# Patient Record
Sex: Male | Born: 1970 | Hispanic: No | Marital: Married | Smoking: Former smoker
Health system: Southern US, Community
[De-identification: ages and names within clinical notes are randomized; demographics above are authoritative.]

## PROBLEM LIST (undated history)

## (undated) DIAGNOSIS — M1990 Unspecified osteoarthritis, unspecified site: Secondary | ICD-10-CM

## (undated) DIAGNOSIS — M5430 Sciatica, unspecified side: Secondary | ICD-10-CM

## (undated) DIAGNOSIS — K219 Gastro-esophageal reflux disease without esophagitis: Secondary | ICD-10-CM

## (undated) HISTORY — DX: Sciatica, unspecified side: M54.30

---
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
## 2003-03-02 ENCOUNTER — Encounter: Payer: Self-pay | Admitting: Emergency Medicine

## 2003-03-02 ENCOUNTER — Emergency Department (HOSPITAL_COMMUNITY): Admission: EM | Admit: 2003-03-02 | Discharge: 2003-03-02 | Payer: Self-pay | Admitting: Emergency Medicine

## 2009-01-18 ENCOUNTER — Emergency Department (HOSPITAL_COMMUNITY): Admission: EM | Admit: 2009-01-18 | Discharge: 2009-01-18 | Payer: Self-pay | Admitting: Family Medicine

## 2012-03-18 ENCOUNTER — Encounter (HOSPITAL_COMMUNITY): Payer: Self-pay | Admitting: Emergency Medicine

## 2012-03-18 ENCOUNTER — Emergency Department (HOSPITAL_COMMUNITY): Payer: Self-pay

## 2012-03-18 ENCOUNTER — Emergency Department (HOSPITAL_COMMUNITY)
Admission: EM | Admit: 2012-03-18 | Discharge: 2012-03-18 | Disposition: A | Discharge status: Home / Self Care | Payer: Self-pay | Attending: Emergency Medicine | Admitting: Emergency Medicine

## 2012-03-18 DIAGNOSIS — IMO0002 Reserved for concepts with insufficient information to code with codable children: Secondary | ICD-10-CM | POA: Insufficient documentation

## 2012-03-18 DIAGNOSIS — X58XXXA Exposure to other specified factors, initial encounter: Secondary | ICD-10-CM | POA: Insufficient documentation

## 2012-03-18 DIAGNOSIS — S46912A Strain of unspecified muscle, fascia and tendon at shoulder and upper arm level, left arm, initial encounter: Secondary | ICD-10-CM

## 2012-03-18 DIAGNOSIS — Z87891 Personal history of nicotine dependence: Secondary | ICD-10-CM | POA: Insufficient documentation

## 2012-03-18 MED ORDER — IBUPROFEN 400 MG PO TABS
800.0000 mg | ORAL_TABLET | Freq: Once | ORAL | Status: AC
  Administered 2012-03-18: 800 mg via ORAL
  Filled 2012-03-18: qty 2

## 2012-03-18 NOTE — ED Notes (Signed)
Pt. Stated, i started having shoulder pain and in my neck and causing my lt. Arm to be numb

## 2012-03-18 NOTE — Discharge Instructions (Signed)
Muscle Strain A muscle strain, or pulled muscle, occurs when a muscle is over-stretched. A small number of muscle fibers may also be torn. This is especially common in athletes. This happens when a sudden violent force placed on a muscle pushes it past its capacity. Usually, recovery from a pulled muscle takes 1 to 2 weeks. But complete healing will take 5 to 6 weeks. There are millions of muscle fibers. Following injury, your body will usually return to normal quickly. HOME CARE INSTRUCTIONS   While awake, apply ice to the sore muscle for 15 to 20 minutes each hour for the first 2 days. Put ice in a plastic bag and place a towel between the bag of ice and your skin.   Do not use the pulled muscle for several days. Do not use the muscle if you have pain.   You may wrap the injured area with an elastic bandage for comfort. Be careful not to bind it too tightly. This may interfere with blood circulation.   Only take over-the-counter or prescription medicines for pain, discomfort, or fever as directed by your caregiver. Do not use aspirin as this will increase bleeding (bruising) at injury site.   Warming up before exercise helps prevent muscle strains.  SEEK MEDICAL CARE IF:  There is increased pain or swelling in the affected area. MAKE SURE YOU:   Understand these instructions.   Will watch your condition.   Will get help right away if you are not doing well or get worse.  Document Released: 09/13/2005 Document Revised: 09/02/2011 Document Reviewed: 04/12/2007 [facility] Patient Information 2012 [place], [place].Shoulder Sprain A shoulder sprain is the result of damage to the tough, fiber-like tissues (ligaments) that help hold your shoulder in place. The ligaments may be stretched or torn. Besides the main shoulder joint (the ball and socket), there are several smaller joints that connect the bones in this area. A sprain usually involves one of those joints. Most often it is the  acromioclavicular (or AC) joint. That is the joint that connects the collarbone (clavicle) and the shoulder blade (scapula) at the top point of the shoulder blade (acromion). A shoulder sprain is a mild form of what is called a shoulder separation. Recovering from a shoulder sprain may take some time. For some, pain lingers for several months. Most people recover without long term problems. CAUSES   A shoulder sprain is usually caused by some kind of trauma. This might be:   Falling on an outstretched arm.   Being hit hard on the shoulder.   Twisting the arm.   Shoulder sprains are more likely to occur in people who:   Play sports.   Have balance or coordination problems.  SYMPTOMS   Pain when you move your shoulder.   Limited ability to move the shoulder.   Swelling and tenderness on top of the shoulder.   Redness or warmth in the shoulder.   Bruising.   A change in the shape of the shoulder.  DIAGNOSIS  Your healthcare provider may:  Ask about your symptoms.   Ask about recent activity that might have caused those symptoms.   Examine your shoulder. You may be asked to do simple exercises to test movement. The other shoulder will be examined for comparison.   Order some tests that provide a look inside the body. They can show the extent of the injury. The tests could include:   X-rays.   CT (computed tomography) scan.   MRI (magnetic resonance imaging)  scan.  RISKS AND COMPLICATIONS  Loss of full shoulder motion.   Ongoing shoulder pain.  TREATMENT  How long it takes to recover from a shoulder sprain depends on how severe it was. Treatment options may include:  Rest. You should not use the arm or shoulder until it heals.   Ice. For 2 or 3 days after the injury, put an ice pack on the shoulder up to 4 times a day. It should stay on for 15 to 20 minutes each time. Wrap the ice in a towel so it does not touch your skin.   Over-the-counter medicine to relieve  pain.   A sling or brace. This will keep the arm still while the shoulder is healing.   Physical therapy or rehabilitation exercises. These will help you regain strength and motion. Ask your healthcare provider when it is OK to begin these exercises.   Surgery. The need for surgery is rare with a sprained shoulder, but some people may need surgery to keep the joint in place and reduce pain.  HOME CARE INSTRUCTIONS   Ask your healthcare provider about what you should and should not do while your shoulder heals.   Make sure you know how to apply ice to the correct area of your shoulder.   Talk with your healthcare provider about which medications should be used for pain and swelling.   If rehabilitation therapy will be needed, ask your healthcare provider to refer you to a therapist. If it is not recommended, then ask about at-home exercises. Find out when exercise should begin.  SEEK MEDICAL CARE IF:  Your pain, swelling, or redness at the joint increases. SEEK IMMEDIATE MEDICAL CARE IF:   You have a fever.   You cannot move your arm or shoulder.  Document Released: 01/30/2009 Document Revised: 09/02/2011 Document Reviewed: 01/30/2009 [facility] Patient Information 2012 [place], [place].

## 2012-03-18 NOTE — ED Provider Notes (Signed)
History   This chart was scribed for Cyndra Numbers, MD scribed by Magnus Sinning. The patient was seen in room TR10C/TR10C seen at 12:25.   CSN: [id number]  Arrival date & time 03/18/12  1106   First MD Initiated Contact with Patient 03/18/12 1203      Chief Complaint  Patient presents with  . Shoulder Pain    (Consider location/radiation/quality/duration/timing/severity/associated sxs/prior treatment) HPI Paul Huff is a 41 y.o. male who presents to the Emergency Department complaining of persistent moderate shoulder pain with associated neck pain, onset 2 days. Patient reports that he is unsure of what has caused pain, but states he just moved from [facility] and that it might be attributed to strain from moving furniture. States that he took an ibuprofen pm last night with no relief and denies use of ice, or hx of injury to shoulder. Patient rates the pain a 9/10 on pain scale.  History reviewed. No pertinent past medical history.  History reviewed. No pertinent past surgical history.  No family history on file.  History  Substance Use Topics  . Smoking status: Former Games developer  . Smokeless tobacco: Not on file  . Alcohol Use: Yes      Review of Systems  Constitutional: Negative.   HENT: Negative.   Eyes: Negative.   Respiratory: Negative.   Cardiovascular: Negative.   Gastrointestinal: Negative.   Genitourinary: Negative.   Musculoskeletal:       Neck and shoulder pain  Skin: Negative.   Neurological: Numbness: arm.  Hematological: Negative.   Psychiatric/Behavioral: Negative.   All other systems reviewed and are negative.    Allergies  Review of patient's allergies indicates no known allergies.  Home Medications   Current Outpatient Rx  Name Route Sig Dispense Refill  . RANITIDINE HCL 150 MG PO TABS Oral Take 150 mg by mouth 2 (two) times daily.      BP 140/90  Pulse 113  Temp 98.1 F (36.7 C) (Oral)  Resp 16  SpO2 98%  Physical Exam  Nursing note and  vitals reviewed.  GEN: Well-developed, well-nourished male in no distress HEENT: Atraumatic, normocephalic. Oropharynx clear without erythema EYES: PERRLA BL, no scleral icterus. NECK: Trachea midline, no meningismus CV: regular rate and rhythm.  PULM: No respiratory distress.  No crackles, wheezes, or rales. Neuro: cranial nerves grossly 2-12 intact, no abnormalities of strength or sensation, A and O x 3 MSK: Tenderness to palpation over the left trapezius muscle. Able to abduct the left shoulder. Only slight limitation of ROM. No deformity. Skin: No rashes petechiae, purpura, or jaundice Psych: no abnormality of mood ED Course  Procedures (including critical care time) DIAGNOSTIC STUDIES: Oxygen Saturation is 98% on room air, normal by my interpretation.    COORDINATION OF CARE:  Dg Shoulder Left  03/18/2012  *RADIOLOGY REPORT*  Clinical Data: Left shoulder pain  LEFT SHOULDER - 2+ VIEW  Comparison: None.  Findings: No acute fracture and no dislocation.  Unremarkable soft tissues.  IMPRESSION: No acute bony pathology.  Original Report Authenticated By: Donavan Burnet, M.D.    1. Left shoulder strain       MDM  Patient was evaluated by myself. Plain film was performed and showed no evidence of fracture the left shoulder. Patient reported only minimal symptoms. He did have tenderness to palpation over the trapezius and this did appear to be more of a muscular pain. Patient was given ibuprofen and ice pack here for his pain. Patient was told that he could use  a tennis ball at home to help massage out the tension in his left shoulder. Patient had no known trauma and I have no concern for rotator cuff tear based on my exam. Patient's felt safe for discharge was discharged home in good condition with instructions to use ice and ibuprofen as needed. He was given contact information for Dr. Pearletha Forge should he require it for further followup though I doubt this will be necessary. I personally  performed the services described in this documentation, which was scribed in my presence. The recorded information has been reviewed and considered.           Cyndra Numbers, MD 03/18/12 2047

## 2012-04-05 ENCOUNTER — Encounter (HOSPITAL_COMMUNITY): Payer: Self-pay | Admitting: *Deleted

## 2012-04-05 DIAGNOSIS — Z87891 Personal history of nicotine dependence: Secondary | ICD-10-CM | POA: Insufficient documentation

## 2012-04-05 DIAGNOSIS — K047 Periapical abscess without sinus: Secondary | ICD-10-CM | POA: Insufficient documentation

## 2012-04-05 NOTE — ED Notes (Signed)
Toothache for 3 days with swollen gums

## 2012-04-06 ENCOUNTER — Emergency Department (HOSPITAL_COMMUNITY)
Admission: EM | Admit: 2012-04-06 | Discharge: 2012-04-06 | Disposition: A | Discharge status: Home / Self Care | Payer: Self-pay | Attending: Emergency Medicine | Admitting: Emergency Medicine

## 2012-04-06 DIAGNOSIS — K047 Periapical abscess without sinus: Secondary | ICD-10-CM

## 2012-04-06 MED ORDER — PENICILLIN V POTASSIUM 500 MG PO TABS: 500.0000 mg | ORAL_TABLET | Freq: Three times a day (TID) | ORAL | Status: AC

## 2012-04-06 MED ORDER — PENICILLIN V POTASSIUM 250 MG PO TABS
500.0000 mg | ORAL_TABLET | Freq: Once | ORAL | Status: AC
  Administered 2012-04-06: 500 mg via ORAL
  Filled 2012-04-06: qty 2

## 2012-04-06 MED ORDER — HYDROCODONE-ACETAMINOPHEN 5-325 MG PO TABS: 1.0000 | ORAL_TABLET | Freq: Four times a day (QID) | ORAL | Status: AC | PRN

## 2012-04-06 NOTE — ED Provider Notes (Signed)
History     CSN: [id number]  Arrival date & time 04/05/12  2333   First MD Initiated Contact with Patient 04/06/12 0033      Chief Complaint  Patient presents with  . Dental Pain   HPI  History provided by the patient. Patient is a 41 year old male with no significant past medical history who presents with complaints of pain and swelling to his right lower first was in tooth. Patient has recently moved back to [place] from [place] and currently does not have a Education officer, community or medical doctor. Patient has been trying to use Orajel over to the gums to help with symptoms but reports increasing pain and swelling to the inside of the gums around the tooth. He denies any bleeding or drainage to the area. He denies any swelling of the tongue or difficulty swallowing or breathing. He denies any fever, chills, sweats.    History reviewed. No pertinent past medical history.  History reviewed. No pertinent past surgical history.  No family history on file.  History  Substance Use Topics  . Smoking status: Former Games developer  . Smokeless tobacco: Not on file  . Alcohol Use: Yes      Review of Systems  Constitutional: Negative for fever and chills.  HENT: Positive for dental problem.   Gastrointestinal: Negative for nausea and vomiting.    Allergies  Review of patient's allergies indicates no known allergies.  Home Medications  No current outpatient prescriptions on file.  BP 149/97  Pulse 84  Temp 98.4 F (36.9 C) (Oral)  Resp 18  SpO2 98%  Physical Exam  Nursing note and vitals reviewed. Constitutional: He is oriented to person, place, and time. He appears well-developed and well-nourished.  HENT:  Head: Normocephalic.       1 cm nodule to the inside of the gums adjacent to the right lower first molar area and nodule is fluctuant and tender to palpation. There is also pain with percussion over the first molar. No significant dental caries appreciated. There is no swelling under  the tongue or signs concerning for Ludwig angina.  Neck: Normal range of motion. Neck supple.  Cardiovascular: Normal rate and regular rhythm.   Pulmonary/Chest: Effort normal and breath sounds normal.  Lymphadenopathy:    He has no cervical adenopathy.  Neurological: He is alert and oriented to person, place, and time.  Skin: Skin is warm.  Psychiatric: He has a normal mood and affect. His behavior is normal.    ED Course  Procedures   INCISION AND DRAINAGE Performed by: Angus Seller Consent: Verbal consent obtained. Risks and benefits: risks, benefits and alternatives were discussed Type: Dental abscess  Body area: Medial right lower first molar  Anesthesia: Dental block  Local anesthetic: Bupivacaine 0.5% with epinephrine   Anesthetic total: 1.8 ml  Complexity: Simple  Drainage: purulent  Drainage amount: moderate  Patient tolerance: Patient tolerated the procedure well with no immediate complications.        1. Dental abscess       MDM  1:10AM patient seen and evaluated. Patient in no acute distress.        Phill Mutter [place], [place] 04/06/12 [phone]

## 2012-04-06 NOTE — ED Provider Notes (Signed)
Medical screening examination/treatment/procedure(s) were performed by non-physician practitioner and as supervising physician I was immediately available for consultation/collaboration.   Charles B. Sheldon, MD 04/06/12 0618 

## 2015-04-08 ENCOUNTER — Encounter (HOSPITAL_COMMUNITY): Payer: Self-pay

## 2015-04-08 ENCOUNTER — Emergency Department (HOSPITAL_COMMUNITY): Payer: Managed Care, Other (non HMO)

## 2015-04-08 ENCOUNTER — Emergency Department (INDEPENDENT_AMBULATORY_CARE_PROVIDER_SITE_OTHER)
Admission: EM | Admit: 2015-04-08 | Discharge: 2015-04-08 | Disposition: A | Discharge status: Nursing Home (Includes ICF) | Payer: Managed Care, Other (non HMO) | Source: Home / Self Care | Attending: Family Medicine | Admitting: Family Medicine

## 2015-04-08 ENCOUNTER — Emergency Department (HOSPITAL_COMMUNITY)
Admission: EM | Admit: 2015-04-08 | Discharge: 2015-04-08 | Disposition: A | Discharge status: Home / Self Care | Payer: Managed Care, Other (non HMO) | Attending: Emergency Medicine | Admitting: Emergency Medicine

## 2015-04-08 ENCOUNTER — Encounter (HOSPITAL_COMMUNITY): Payer: Self-pay | Admitting: Emergency Medicine

## 2015-04-08 DIAGNOSIS — R0789 Other chest pain: Secondary | ICD-10-CM | POA: Diagnosis not present

## 2015-04-08 DIAGNOSIS — Z720 Tobacco use: Secondary | ICD-10-CM

## 2015-04-08 DIAGNOSIS — Z87891 Personal history of nicotine dependence: Secondary | ICD-10-CM | POA: Diagnosis not present

## 2015-04-08 DIAGNOSIS — K219 Gastro-esophageal reflux disease without esophagitis: Secondary | ICD-10-CM

## 2015-04-08 DIAGNOSIS — R079 Chest pain, unspecified: Secondary | ICD-10-CM | POA: Diagnosis not present

## 2015-04-08 HISTORY — DX: Gastro-esophageal reflux disease without esophagitis: K21.9

## 2015-04-08 LAB — BASIC METABOLIC PANEL
Anion gap: 10 (ref 5–15)
BUN: 11 mg/dL (ref 6–20)
CALCIUM: 9.2 mg/dL (ref 8.9–10.3)
CO2: 25 mmol/L (ref 22–32)
Chloride: 102 mmol/L (ref 101–111)
Creatinine, Ser: 1.01 mg/dL (ref 0.61–1.24)
GFR calc Af Amer: >60 mL/min (ref >60)
GFR calc non Af Amer: >60 mL/min (ref >60)
GLUCOSE: 93 mg/dL (ref 65–99)
POTASSIUM: 4 mmol/L (ref 3.5–5.1)
SODIUM: 137 mmol/L (ref 135–145)

## 2015-04-08 LAB — CBC
HEMATOCRIT: 46.1 % (ref 39.0–52.0)
Hemoglobin: 16.6 g/dL (ref 13.0–17.0)
MCH: 29.1 pg (ref 26.0–34.0)
MCHC: 36 g/dL (ref 30.0–36.0)
MCV: 80.7 fL (ref 78.0–100.0)
Platelets: 306 10*3/uL (ref 150–400)
RBC: 5.71 MIL/uL (ref 4.22–5.81)
RDW: 13.5 % (ref 11.5–15.5)
WBC: 14.3 10*3/uL — AB (ref 4.0–10.5)

## 2015-04-08 LAB — I-STAT TROPONIN, ED: Troponin i, poc: 0 ng/mL (ref 0.00–0.08)

## 2015-04-08 MED ORDER — GI COCKTAIL ~~LOC~~
30.0000 mL | Freq: Once | ORAL | Status: AC
  Administered 2015-04-08: 30 mL via ORAL
  Filled 2015-04-08: qty 30

## 2015-04-08 MED ORDER — ESOMEPRAZOLE MAGNESIUM 40 MG PO CPDR: 40.0000 mg | DELAYED_RELEASE_CAPSULE | Freq: Every day | ORAL | Status: DC

## 2015-04-08 NOTE — ED Notes (Signed)
Pt c/o intermittent mild chest pains onset x3 months associated w/HA Reports chest pain increases when he sits back or lays down on his back Denies SOB, dyspnea, blurry vision, diaphoresis Alert, talking in complete sentences w/no signs of acute distress.

## 2015-04-08 NOTE — Discharge Instructions (Signed)
Chest Pain (Nonspecific) °It is often hard to give a specific diagnosis for the cause of chest pain. There is always a chance that your pain could be related to something serious, such as a heart attack or a blood clot in the lungs. You need to follow up with your health care provider for further evaluation. °CAUSES  °· Heartburn. °· Pneumonia or bronchitis. °· Anxiety or stress. °· Inflammation around your heart (pericarditis) or lung (pleuritis or pleurisy). °· A blood clot in the lung. °· A collapsed lung (pneumothorax). It can develop suddenly on its own (spontaneous pneumothorax) or from trauma to the chest. °· Shingles infection (herpes zoster virus). °The chest wall is composed of bones, muscles, and cartilage. Any of these can be the source of the pain. °· The bones can be bruised by injury. °· The muscles or cartilage can be strained by coughing or overwork. °· The cartilage can be affected by inflammation and become sore (costochondritis). °DIAGNOSIS  °Lab tests or other studies may be needed to find the cause of your pain. Your health care provider may have you take a test called an ambulatory electrocardiogram (ECG). An ECG records your heartbeat patterns over a 24-hour period. You may also have other tests, such as: °· Transthoracic echocardiogram (TTE). During echocardiography, sound waves are used to evaluate how blood flows through your heart. °· Transesophageal echocardiogram (TEE). °· Cardiac monitoring. This allows your health care provider to monitor your heart rate and rhythm in real time. °· Holter monitor. This is a portable device that records your heartbeat and can help diagnose heart arrhythmias. It allows your health care provider to track your heart activity for several days, if needed. °· Stress tests by exercise or by giving medicine that makes the heart beat faster. °TREATMENT  °· Treatment depends on what may be causing your chest pain. Treatment may include: °· Acid blockers for  heartburn. °· Anti-inflammatory medicine. °· Pain medicine for inflammatory conditions. °· Antibiotics if an infection is present. °· You may be advised to change lifestyle habits. This includes stopping smoking and avoiding alcohol, caffeine, and chocolate. °· You may be advised to keep your head raised (elevated) when sleeping. This reduces the chance of acid going backward from your stomach into your esophagus. °Most of the time, nonspecific chest pain will improve within 2-3 days with rest and mild pain medicine.  °HOME CARE INSTRUCTIONS  °· If antibiotics were prescribed, take them as directed. Finish them even if you start to feel better. °· For the next few days, avoid physical activities that bring on chest pain. Continue physical activities as directed. °· Do not use any tobacco products, including cigarettes, chewing tobacco, or electronic cigarettes. °· Avoid drinking alcohol. °· Only take medicine as directed by your health care provider. °· Follow your health care provider's suggestions for further testing if your chest pain does not go away. °· Keep any follow-up appointments you made. If you do not go to an appointment, you could develop lasting (chronic) problems with pain. If there is any problem keeping an appointment, call to reschedule. °SEEK MEDICAL CARE IF:  °· Your chest pain does not go away, even after treatment. °· You have a rash with blisters on your chest. °· You have a fever. °SEEK IMMEDIATE MEDICAL CARE IF:  °· You have increased chest pain or pain that spreads to your arm, neck, jaw, back, or abdomen. °· You have shortness of breath. °· You have an increasing cough, or you cough   up blood. °· You have severe back or abdominal pain. °· You feel nauseous or vomit. °· You have severe weakness. °· You faint. °· You have chills. °This is an emergency. Do not wait to see if the pain will go away. Get medical help at once. Call your local emergency services (911 in [place] Do not drive  yourself to the hospital. °MAKE SURE YOU:  °· Understand these instructions. °· Will watch your condition. °· Will get help right away if you are not doing well or get worse. °Document Released: 06/23/2005 Document Revised: 09/18/2013 Document Reviewed: 04/18/2008 °ExitCare® Patient Information ©2015 [employer]. This information is not intended to replace advice given to you by your health care provider. Make sure you discuss any questions you have with your health care provider. °Gastroesophageal Reflux Disease, Adult °Gastroesophageal reflux disease (GERD) happens when acid from your stomach flows up into the esophagus. When acid comes in contact with the esophagus, the acid causes soreness (inflammation) in the esophagus. Over time, GERD may create small holes (ulcers) in the lining of the esophagus. °CAUSES  °· Increased body weight. This puts pressure on the stomach, making acid rise from the stomach into the esophagus. °· Smoking. This increases acid production in the stomach. °· Drinking alcohol. This causes decreased pressure in the lower esophageal sphincter (valve or ring of muscle between the esophagus and stomach), allowing acid from the stomach into the esophagus. °· Late evening meals and a full stomach. This increases pressure and acid production in the stomach. °· A malformed lower esophageal sphincter. °Sometimes, no cause is found. °SYMPTOMS  °· Burning pain in the lower part of the mid-chest behind the breastbone and in the mid-stomach area. This may occur twice a week or more often. °· Trouble swallowing. °· Sore throat. °· Dry cough. °· Asthma-like symptoms including chest tightness, shortness of breath, or wheezing. °DIAGNOSIS  °Your caregiver may be able to diagnose GERD based on your symptoms. In some cases, X-rays and other tests may be done to check for complications or to check the condition of your stomach and esophagus. °TREATMENT  °Your caregiver may recommend over-the-counter or  prescription medicines to help decrease acid production. Ask your caregiver before starting or adding any new medicines.  °HOME CARE INSTRUCTIONS  °· Change the factors that you can control. Ask your caregiver for guidance concerning weight loss, quitting smoking, and alcohol consumption. °· Avoid foods and drinks that make your symptoms worse, such as: °¨ Caffeine or alcoholic drinks. °¨ Chocolate. °¨ Peppermint or mint flavorings. °¨ Garlic and onions. °¨ Spicy foods. °¨ Citrus fruits, such as oranges, lemons, or limes. °¨ Tomato-based foods such as sauce, chili, salsa, and pizza. °¨ Fried and fatty foods. °· Avoid lying down for the 3 hours prior to your bedtime or prior to taking a nap. °· Eat small, frequent meals instead of large meals. °· Wear loose-fitting clothing. Do not wear anything tight around your waist that causes pressure on your stomach. °· Raise the head of your bed 6 to 8 inches with wood blocks to help you sleep. Extra pillows will not help. °· Only take over-the-counter or prescription medicines for pain, discomfort, or fever as directed by your caregiver. °· Do not take aspirin, ibuprofen, or other nonsteroidal anti-inflammatory drugs (NSAIDs). °SEEK IMMEDIATE MEDICAL CARE IF:  °· You have pain in your arms, neck, jaw, teeth, or back. °· Your pain increases or changes in intensity or duration. °· You develop nausea, vomiting, or sweating (diaphoresis). °·   diaphoresis).  You develop shortness of breath, or you faint.  Your vomit is green, yellow, black, or looks like coffee grounds or blood.  Your stool is red, bloody, or black. These symptoms could be signs of other problems, such as heart disease, gastric bleeding, or esophageal bleeding. MAKE SURE YOU:   Understand these instructions.  Will watch your condition.  Will get help right away if you are not doing well or get worse. Document Released: 06/23/2005 Document Revised: 12/06/2011 Document Reviewed: 04/02/2011 [facility] Patient  Information 2015 [place], [place]. This information is not intended to replace advice given to you by your health care provider. Make sure you discuss any questions you have with your health care provider.  Esomeprazole capsules What is this medicine? ESOMEPRAZOLE (es oh ME pray zol) prevents the production of acid in the stomach. It is used to treat gastroesophageal reflux disease (GERD), ulcers, certain bacteria in the stomach, and inflammation of the esophagus. It can also be used to prevent ulcers in patients taking medicines called NSAIDs. You can also buy this medicine without a prescription to treat the symptoms of heartburn. The non-prescription product is not for long-term use, unless otherwise directed by your doctor or health care professional. This medicine may be used for other purposes; ask your health care provider or pharmacist if you have questions. COMMON BRAND NAME(S): Nexium, Nexium 24HR What should I tell my health care provider before I take this medicine? They need to know if you have any of these conditions: -bloody or black, tarry stools -chest pain -have had heartburn for over 3 months -have heartburn with dizziness, lightheadedness, or sweating -liver disease -low levels of magnesium in the blood -nausea, vomiting -stomach pain -trouble swallowing -unexplained weight loss -vomiting with blood -wheezing -an unusual or allergic reaction to esomeprazole, other medicines, foods, dyes, or preservatives -pregnant or trying to get pregnant -breast-feeding How should I use this medicine? Take this medicine by mouth. Swallow the capsules whole with a drink of water. Follow the directions on the prescription or product label. Do not crush, break or chew. The capsules can be opened and the contents sprinkled on applesauce. Do not crush the contents into the food. This medicine works best if taken on an empty stomach at least one hour before a meal. Take your medicine at  regular intervals. Do not take your medicine more often than directed. Talk to your pediatrician regarding the use of this medicine in children. Special care may be needed. Overdosage: If you think you have taken too much of this medicine contact a poison control center or emergency room at once. NOTE: This medicine is only for you. Do not share this medicine with others. What if I miss a dose? If you miss a dose, take it as soon as you can. If it is almost time for your next dose, take only that dose. Do not take double or extra doses. What may interact with this medicine? Do not take this medicine with any of the following medications: -atazanavir This medicine may also interact with the following medications: -ampicillin -antiviral medicines for HIV or AIDS -certain medicines for fungal infections like ketoconazole and itraconazole -cilostazol -clopidogrel -diazepam -digoxin -erlotinib -diuretics -iron salts -methotrexate -St. John's Wort -tacrolimus -rifampin -warfarin This list may not describe all possible interactions. Give your health care provider a list of all the medicines, herbs, non-prescription drugs, or dietary supplements you use. Also tell them if you smoke, drink alcohol, or use illegal drugs. Some items may  interact with your medicine. What should I watch for while using this medicine? If you are taking this medicine without a prescription, it may take 1 to 4 days for it to fully relieve your heartburn. If you are using this medicine with a prescription from your healthcare professional for a more serious condition, it can take several days before your condition gets better. Check with your doctor or health care professional if your condition does not start to get better, or if it gets worse. If you take this medicine for long periods of time, you may need blood work done. What side effects may I notice from receiving this medicine? Side effects that you should  report to your doctor or health care professional as soon as possible: -allergic reactions like skin rash, itching or hives, swelling of the face, lips, or tongue -bone, muscle or joint pain -breathing problems -chest pain or chest tightness -dark yellow or brown urine -fast, irregular heartbeat -feeling faint or lightheaded -fever or sore throat -muscle spasms -tremors -unusual bleeding or bruising -unusually weak or tired -upset stomach -yellowing of the eyes or skin Side effects that usually do not require medical attention (Report these to your doctor or health care professional if they continue or are bothersome.): -constipation -diarrhea -dry mouth -headache -nausea -stomach pain or gas -vomiting This list may not describe all possible side effects. Call your doctor for medical advice about side effects. You may report side effects to FDA at 1-800-FDA-1088. Where should I keep my medicine? Keep out of the reach of children. Store at room temperature between 15 and 30 degrees C (59 and 86 degrees F). Protect from light and moisture. Throw away any unused medicine after the expiration date. NOTE: This sheet is a summary. It may not cover all possible information. If you have questions about this medicine, talk to your doctor, pharmacist, or health care provider.  2015, Elsevier/Gold Standard. (2012-12-26 14:58:20)

## 2015-04-08 NOTE — ED Provider Notes (Signed)
CSN: [id number]     Arrival date & time 04/08/15  1516 History   First MD Initiated Contact with Patient 04/08/15 1842     Chief Complaint  Patient presents with  . Chest Pain     (Consider location/radiation/quality/duration/timing/severity/associated sxs/prior Treatment) Patient is a 44 y.o. male presenting with chest pain. The history is provided by the patient.  Chest Pain He was sent here from urgent care after he presented with an episode of chest pain. He has been having chest pains for at least the last month. Pain will be present for several days and then resolved. 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 He describes a background tight feeling and then episodes of worsening pain which has difficulty describing as well as a sense like his heart is not pumping blood properly. There is no associated dyspnea, nausea, diaphoresis. Pain seems to be worse when supine and better when walking. He did have one episode that started while he was running on a treadmill, but other times he has run on the treadmill with no symptoms whatsoever. He has not noticed any change in his exercise tolerance. Of note, this history is distinct and different from the history that was given at urgent care. I repeatedly questioned him about this and he is very clear that pain is worse when supine and lying still and better when up and walking. He stopped smoking 6 months ago. He denies history of hypertension, diabetes, hyperlipidemia. Family history is unknown since he was abandoned as a child. He does treat himself for acid reflux and takes Nexium on an as-needed basis. Last dose of Nexium was about 3 days ago.   Past Medical History  Diagnosis Date  . Acid reflux    History reviewed. No pertinent past surgical history. History reviewed. No pertinent family history. History  Substance Use Topics  . Smoking status: Former Games developermoker  .  Smokeless tobacco: Not on file  . Alcohol Use: Yes     Comment: occ    Review of Systems  Cardiovascular: Positive for chest pain.  All other systems reviewed and are negative.     Allergies  Review of patient's allergies indicates no known allergies.  Home Medications   Prior to Admission medications   Not on File   BP 140/95 mmHg  Pulse 71  Temp(Src) 98.4 F (36.9 C) (Oral)  Resp 20  Ht 5' 11.5" (1.816 m)  Wt 284 lb 6.4 oz (129.003 kg)  BMI 39.12 kg/m2  SpO2 97% Physical Exam  Nursing note and vitals reviewed.  44 year old male, resting comfortably and in no acute distress. Vital signs are significant for borderline hypertension. Oxygen saturation is 97%, which is normal. Head is normocephalic and atraumatic. PERRLA, EOMI. Oropharynx is clear. Neck is nontender and supple without adenopathy or JVD. Back is nontender and there is no CVA tenderness. Lungs are clear without rales, wheezes, or rhonchi. Chest is nontender. Heart has regular rate and rhythm without murmur. Abdomen is soft, flat, nontender without masses or hepatosplenomegaly and peristalsis is normoactive. Extremities have no cyanosis or edema, full range of motion is present. Skin is warm and dry without rash. Neurologic: Mental status is normal, cranial nerves are intact, there are no motor or sensory deficits.  ED Course  Procedures (including critical care time) Labs Review Labs Reviewed  CBC - Abnormal; Notable for the following:  WBC 14.3 (*)    All other components within normal limits  BASIC METABOLIC PANEL  I-STAT TROPOININ, ED    Imaging RevRosezena Sensor2 View  04/08/2015   CLINICAL DATA:  Chest pain  EXAM: CHEST  2 VIEW  COMPARISON:  None.  FINDINGS: Cardiomediastinal silhouette is stable. No acute infiltrate or pleural effusion. No pulmonary edema. Bony thorax is unremarkable.  IMPRESSION: No active cardiopulmonary disease.   Electronically Signed   By: Natasha Mead M.D.   On:  04/08/2015 16:11     EKG Interpretation   Date/Time:  Tuesday April 08 2015 15:17:58 EDT Ventricular Rate:  78 PR Interval:  132 QRS Duration: 80 QT Interval:  392 QTC Calculation: 446 R Axis:   42 Text Interpretation:  Normal sinus rhythm Normal ECG No significant change  since last tracing Confirmed by [facility]  MD, Jacelynn Hayton ([id number]) on 04/08/2015  3:22:29 PM      MDM   Final diagnoses:  Atypical chest pain  GERD without esophagitis    Atypical chest pain. Given the atypical nature of pain and absence of cardiac risk factors, I feel this is very unlikely to be cardiac pain. Heart Score = 0. With known history of acid reflux and symptoms worsen when being supine, I strongly suspect this is a manifestation of his underlying acid reflux. He will be given therapeutic trial of a GI cocktail. He will be referred to cardiology for consideration for outpatient stress testing.  He had good relief of symptoms with GI cocktail. He is discharged with prescription for S omeprazole to take on a daily basis.  Dione Booze, MD 04/08/15 [phone]

## 2015-04-08 NOTE — ED Notes (Signed)
Pt sent here from urgent care.  Pt c/o intermittant chest pain, radiating from left to right and to center of chedst, occurs several times a week that is constant pain and would last all day.  Intermittant pain x 2 days.  Pain is worse when sitting or laying back.   No other s/s noted. NAD, talking in complete sentences.

## 2015-04-08 NOTE — ED Provider Notes (Signed)
CSN: [id number]     Arrival date & time 04/08/15  1403 History   First MD Initiated Contact with Patient 04/08/15 1434     Chief Complaint  Patient presents with  . Chest Pain   (Consider location/radiation/quality/duration/timing/severity/associated sxs/prior Treatment) HPI Comments: 44 year old male complaining of chest pain, intermittent for 3 months. He says sometimes it will be in the left anterior chest others the right anterior chest but most over the mid chest or precordium. He describes it as a "elephant sitting on his chest, tightness or heaviness". Sometimes that will last 3-4 days at a time before disappearing. Then he may go through a few days and have no pain. He is recently started exercising and has found that after a few times on the treadmill that afterwards he will develop chest pain as described above. Rest seems to help mitigate the pain. He states the pain is sometimes severe especially when he is supine. Standing will mitigate the chest heaviness and pain. He denies any associated diaphoresis, GI symptoms, GU symptoms, shortness of breath. He has a history of GERD but states that none of this complaint for chest pain feels like GERD. He takes Nexium and feels as though he is [place] Ana as well controlled. He is tried taking ibuprofen for chest pain and has been of no relief. He has a approximately 20 year history of smoking which she has stopped about 6 months ago. He is currently having no chest discomfort or other symptoms. He is asymptomatic now. He was prompted to get checked today by his supervisor after he complained of chest pain again. He has no known history of cardiopulmonary disease. He is unaware of any family history as he was abandoned as a small child.   History reviewed. No pertinent past medical history. History reviewed. No pertinent past surgical history. No family history on file. History  Substance Use Topics  . Smoking status: Former Games developer  . Smokeless  tobacco: Not on file  . Alcohol Use: Yes    Review of Systems  Constitutional: Negative for fever, activity change and fatigue.  HENT: Negative.   Respiratory: Positive for chest tightness. Negative for cough, shortness of breath, wheezing and stridor.   Cardiovascular: Positive for chest pain and palpitations. Negative for leg swelling.  Gastrointestinal: Negative.   Musculoskeletal: Negative.   Skin: Negative.   Neurological: Negative.   Psychiatric/Behavioral: Negative.     Allergies  Review of patient's allergies indicates no known allergies.  Home Medications   Prior to Admission medications   Not on File   BP 140/92 mmHg  Pulse 80  Temp(Src) 98.3 F (36.8 C) (Oral)  Resp 16  SpO2 98% Physical Exam  Constitutional: He is oriented to person, place, and time. He appears well-developed and well-nourished. No distress.  HENT:  Head: Normocephalic and atraumatic.  Eyes: Conjunctivae and EOM are normal. Pupils are equal, round, and reactive to light.  Neck: Normal range of motion. Neck supple.  Carotid pulses 2+ bilaterally  Cardiovascular: Normal rate, regular rhythm, normal heart sounds and intact distal pulses.   No murmur heard. Pulmonary/Chest: Effort normal and breath sounds normal. No respiratory distress. He has no wheezes. He has no rales. He exhibits no tenderness.  Musculoskeletal: He exhibits no edema or tenderness.  Lymphadenopathy:    He has no cervical adenopathy.  Neurological: He is alert and oriented to person, place, and time. He exhibits normal muscle tone.  Skin: Skin is warm and dry. No rash noted. No erythema.  Psychiatric: He has a normal mood and affect.  Nursing note and vitals reviewed.   ED Course  Procedures (including critical care time) Labs Review Labs Reviewed - No data to display  Imaging Review No results found. ED ECG REPORT   Date: 04/08/2015  Rate: 74  Rhythm: normal sinus rhythm  QRS Axis: normal  Intervals: normal   ST/T Wave abnormalities: normal  Conduction Disutrbances:none  Narrative Interpretation:   Old EKG Reviewed: none available  I have personally reviewed the EKG tracing and agree with the computerized printout as noted.   MDM   1. Chest pain, unspecified chest pain type   2. History of smoking    Transfer to ED chest pain center via care Link for evaluation of chest pain. In Urgent care asymptomatic, No pain, jovial.   Hayden Rasmussenavid Delorese Sellin, NP 04/08/15 [phone]

## 2016-04-09 ENCOUNTER — Encounter (HOSPITAL_COMMUNITY): Payer: Self-pay

## 2016-04-09 ENCOUNTER — Emergency Department (HOSPITAL_COMMUNITY)
Admission: EM | Admit: 2016-04-09 | Discharge: 2016-04-09 | Disposition: A | Discharge status: Home / Self Care | Payer: Managed Care, Other (non HMO) | Attending: Emergency Medicine | Admitting: Emergency Medicine

## 2016-04-09 DIAGNOSIS — Z87891 Personal history of nicotine dependence: Secondary | ICD-10-CM | POA: Insufficient documentation

## 2016-04-09 DIAGNOSIS — R1031 Right lower quadrant pain: Secondary | ICD-10-CM

## 2016-04-09 LAB — COMPREHENSIVE METABOLIC PANEL
ALBUMIN: 4.6 g/dL (ref 3.5–5.0)
ALT: 21 U/L (ref 17–63)
AST: 18 U/L (ref 15–41)
Alkaline Phosphatase: 66 U/L (ref 38–126)
Anion gap: 8 (ref 5–15)
BILIRUBIN TOTAL: 0.8 mg/dL (ref 0.3–1.2)
BUN: 13 mg/dL (ref 6–20)
CO2: 26 mmol/L (ref 22–32)
CREATININE: 1.16 mg/dL (ref 0.61–1.24)
Calcium: 9 mg/dL (ref 8.9–10.3)
Chloride: 103 mmol/L (ref 101–111)
GFR calc Af Amer: >60 mL/min (ref >60)
Glucose, Bld: 92 mg/dL (ref 65–99)
Potassium: 4 mmol/L (ref 3.5–5.1)
Sodium: 137 mmol/L (ref 135–145)
TOTAL PROTEIN: 7.7 g/dL (ref 6.5–8.1)

## 2016-04-09 LAB — URINALYSIS, ROUTINE W REFLEX MICROSCOPIC
GLUCOSE, UA: NEGATIVE mg/dL
HGB URINE DIPSTICK: NEGATIVE
Ketones, ur: NEGATIVE mg/dL
LEUKOCYTES UA: NEGATIVE
Nitrite: NEGATIVE
Protein, ur: NEGATIVE mg/dL
Specific Gravity, Urine: 1.03 (ref 1.005–1.030)
pH: 6 (ref 5.0–8.0)

## 2016-04-09 LAB — CBC
HEMATOCRIT: 45.5 % (ref 39.0–52.0)
HEMOGLOBIN: 15.9 g/dL (ref 13.0–17.0)
MCH: 28.8 pg (ref 26.0–34.0)
MCHC: 34.9 g/dL (ref 30.0–36.0)
MCV: 82.3 fL (ref 78.0–100.0)
Platelets: 338 10*3/uL (ref 150–400)
RBC: 5.53 MIL/uL (ref 4.22–5.81)
RDW: 13 % (ref 11.5–15.5)
WBC: 15.3 10*3/uL — ABNORMAL HIGH (ref 4.0–10.5)

## 2016-04-09 LAB — LIPASE, BLOOD: Lipase: 29 U/L (ref 11–51)

## 2016-04-09 MED ORDER — METHOCARBAMOL 500 MG PO TABS: 500.0000 mg | ORAL_TABLET | Freq: Two times a day (BID) | ORAL | Status: DC

## 2016-04-09 MED ORDER — NAPROXEN 500 MG PO TABS: 500.0000 mg | ORAL_TABLET | Freq: Two times a day (BID) | ORAL | Status: DC

## 2016-04-09 NOTE — Progress Notes (Signed)
Patient listed as [place] without [place]cp.  EDCM spoke to patient at bedside.  [facility] with list of [facility]ps who accept [place] within a ten mile radius of patient's zip code [phone].  Discussed importance and purpose of having a pcp.  Patient verbalized understanding and thankful for resources.  No further EDCM needs at this time.

## 2016-04-09 NOTE — ED Provider Notes (Signed)
CSN: [id number]     Arrival date & time 04/09/16  1617 History   First MD Initiated Contact with Patient 04/09/16 1642     Chief Complaint  Patient presents with  . Groin Pain  . Nausea    HPI   Paul Huff is a 45 y.o. male PMH significant for acid reflux presenting with a 2 week history of abdominal pain. He describes his pain as right groin in location, nonradiating, constant, worse with laughing and coughing. He endorses nausea. No fevers, chills, nausea/vomiting, changes in bowel/bladder habits.    Past Medical History  Diagnosis Date  . Acid reflux    History reviewed. No pertinent past surgical history. History reviewed. No pertinent family history. Social History  Substance Use Topics  . Smoking status: Former Games developermoker  . Smokeless tobacco: None  . Alcohol Use: Yes     Comment: occ    Review of Systems  Ten systems are reviewed and are negative for acute change except as noted in the HPI   Allergies  Review of patient's allergies indicates no known allergies.  Home Medications   Prior to Admission medications   Medication Sig Start Date End Date Taking? Authorizing Provider  esomeprazole (NEXIUM) 40 MG capsule Take 1 capsule (40 mg total) by mouth daily. 04/08/15  Yes Dione Boozeavid Glick, MD   BP 128/95 mmHg  Pulse 76  Temp(Src) 98.7 F (37.1 C) (Oral)  Resp 18  Ht 5\' 11"  (1.803 m)  Wt 115.667 kg  BMI 35.58 kg/m2  SpO2 100% Physical Exam  Constitutional: He appears well-developed and well-nourished. No distress.  HENT:  Head: Normocephalic and atraumatic.  Mouth/Throat: Oropharynx is clear and moist. No oropharyngeal exudate.  Eyes: Conjunctivae are normal. Pupils are equal, round, and reactive to light. Right eye exhibits no discharge. Left eye exhibits no discharge. No scleral icterus.  Neck: No tracheal deviation present.  Cardiovascular: Normal rate, regular rhythm, normal heart sounds and intact distal pulses.  Exam reveals no gallop and no friction rub.    No murmur heard. Pulmonary/Chest: Effort normal and breath sounds normal. No respiratory distress. He has no wheezes. He has no rales. He exhibits no tenderness.  Abdominal: Soft. Bowel sounds are normal. He exhibits no distension and no mass. There is no tenderness. There is no rebound and no guarding.  Genitourinary: Penis normal.  Genitourinary Comments: Scrotum normal  Musculoskeletal: He exhibits no edema.  Lymphadenopathy:    He has no cervical adenopathy.  Neurological: He is alert. Coordination normal.  Skin: Skin is warm and dry. No rash noted. He is not diaphoretic. No erythema.  Psychiatric: He has a normal mood and affect. His behavior is normal.  Nursing note and vitals reviewed.   ED Course  Procedures (including critical care time) Labs Review Labs Reviewed  CBC - Abnormal; Notable for the following:    WBC 15.3 (*)    All other components within normal limits  URINALYSIS, ROUTINE W REFLEX MICROSCOPIC (NOT AT [facility]) - Abnormal; Notable for the following:    Bilirubin Urine SMALL (*)    All other components within normal limits  COMPREHENSIVE METABOLIC PANEL  LIPASE, BLOOD    Imaging Review  I have personally reviewed and evaluated these images and lab results as part of my medical decision-making.   EKG Interpretation None      MDM   Final diagnoses:  Right lower quadrant abdominal pain   Patient nontender on exam. Nonspecific leukocytosis of 15.3. Labs otherwise unremarkable. Doubt appendicitis, torsion,  diverticulitis. Patient may be safely discharged home. Discussed reasons for return. Patient to follow-up with primary care provider within one week. Patient in understanding and agreement with the plan.   Melton Krebs, PA-C 05/02/16 2128  Raeford Razor, MD 05/10/16 [phone]

## 2016-04-09 NOTE — ED Notes (Signed)
Pt here with abdominal pain x 2 weeks.  Nausea today.  No change in urination.  Worse with movement.  Laughing and coughing makes it worse.  Never been told he has hernia.  No increase in physical activity.  No fever.

## 2016-04-09 NOTE — Discharge Instructions (Signed)
Mr. Paul Huff,  Nice meeting you! Please follow-up with your primary care provider. Return to the emergency department if you develop fevers, chills, nausea/vomiting, worsening pain, new/worsening symptoms. Feel better soon!  S. Lane HackerNicole Marly Schuld, PA-C Abdominal Pain, Adult Many things can cause abdominal pain. Usually, abdominal pain is not caused by a disease and will improve without treatment. It can often be observed and treated at home. Your health care provider will do a physical exam and possibly order blood tests and X-rays to help determine the seriousness of your pain. However, in many cases, more time must pass before a clear cause of the pain can be found. Before that point, your health care provider may not know if you need more testing or further treatment. HOME CARE INSTRUCTIONS Monitor your abdominal pain for any changes. The following actions may help to alleviate any discomfort you are experiencing:  Only take over-the-counter or prescription medicines as directed by your health care provider.  Do not take laxatives unless directed to do so by your health care provider.  Try a clear liquid diet (broth, tea, or water) as directed by your health care provider. Slowly move to a bland diet as tolerated. SEEK MEDICAL CARE IF:  You have unexplained abdominal pain.  You have abdominal pain associated with nausea or diarrhea.  You have pain when you urinate or have a bowel movement.  You experience abdominal pain that wakes you in the night.  You have abdominal pain that is worsened or improved by eating food.  You have abdominal pain that is worsened with eating fatty foods.  You have a fever. SEEK IMMEDIATE MEDICAL CARE IF:  Your pain does not go away within 2 hours.  You keep throwing up (vomiting).  Your pain is felt only in portions of the abdomen, such as the right side or the left lower portion of the abdomen.  You pass bloody or black tarry stools. MAKE SURE  YOU:  Understand these instructions.  Will watch your condition.  Will get help right away if you are not doing well or get worse.   This information is not intended to replace advice given to you by your health care provider. Make sure you discuss any questions you have with your health care provider.   Document Released: 06/23/2005 Document Revised: 06/04/2015 Document Reviewed: 05/23/2013 Elsevier Interactive Patient Education [employer]2016 [employer]

## 2016-04-17 ENCOUNTER — Emergency Department (HOSPITAL_COMMUNITY)
Admission: EM | Admit: 2016-04-17 | Discharge: 2016-04-17 | Disposition: A | Discharge status: Home / Self Care | Payer: Managed Care, Other (non HMO) | Attending: Emergency Medicine | Admitting: Emergency Medicine

## 2016-04-17 ENCOUNTER — Encounter (HOSPITAL_COMMUNITY): Payer: Self-pay | Admitting: Emergency Medicine

## 2016-04-17 ENCOUNTER — Emergency Department (HOSPITAL_COMMUNITY): Payer: Managed Care, Other (non HMO)

## 2016-04-17 DIAGNOSIS — Z79899 Other long term (current) drug therapy: Secondary | ICD-10-CM | POA: Insufficient documentation

## 2016-04-17 DIAGNOSIS — R079 Chest pain, unspecified: Secondary | ICD-10-CM

## 2016-04-17 DIAGNOSIS — R0789 Other chest pain: Secondary | ICD-10-CM | POA: Diagnosis present

## 2016-04-17 DIAGNOSIS — R002 Palpitations: Secondary | ICD-10-CM | POA: Diagnosis not present

## 2016-04-17 DIAGNOSIS — Z791 Long term (current) use of non-steroidal anti-inflammatories (NSAID): Secondary | ICD-10-CM | POA: Insufficient documentation

## 2016-04-17 DIAGNOSIS — Z87891 Personal history of nicotine dependence: Secondary | ICD-10-CM | POA: Diagnosis not present

## 2016-04-17 LAB — BASIC METABOLIC PANEL
ANION GAP: 7 (ref 5–15)
BUN: 9 mg/dL (ref 6–20)
CALCIUM: 9.4 mg/dL (ref 8.9–10.3)
CO2: 25 mmol/L (ref 22–32)
Chloride: 105 mmol/L (ref 101–111)
Creatinine, Ser: 1.01 mg/dL (ref 0.61–1.24)
GFR calc Af Amer: >60 mL/min (ref >60)
GLUCOSE: 122 mg/dL — AB (ref 65–99)
POTASSIUM: 3.7 mmol/L (ref 3.5–5.1)
SODIUM: 137 mmol/L (ref 135–145)

## 2016-04-17 LAB — CBC
HEMATOCRIT: 45 % (ref 39.0–52.0)
HEMOGLOBIN: 16 g/dL (ref 13.0–17.0)
MCH: 28.6 pg (ref 26.0–34.0)
MCHC: 35.6 g/dL (ref 30.0–36.0)
MCV: 80.4 fL (ref 78.0–100.0)
Platelets: 343 10*3/uL (ref 150–400)
RBC: 5.6 MIL/uL (ref 4.22–5.81)
RDW: 13.2 % (ref 11.5–15.5)
WBC: 13.6 10*3/uL — AB (ref 4.0–10.5)

## 2016-04-17 LAB — I-STAT TROPONIN, ED: TROPONIN I, POC: 0.01 ng/mL (ref 0.00–0.08)

## 2016-04-17 MED ORDER — OMEPRAZOLE 20 MG PO CPDR: 20.0000 mg | DELAYED_RELEASE_CAPSULE | Freq: Every day | ORAL | Status: DC

## 2016-04-17 NOTE — ED Provider Notes (Signed)
CSN: [id number]     Arrival date & time 04/17/16  1315 History   First MD Initiated Contact with Patient 04/17/16 1334     No chief complaint on file.   Paul Huff is a 45 y.o. male who presents to the ED Complaining of having intermittent chest tightness ongoing for the past 6-7 months. Patient reports today he was sitting in his car when he began having chest tightness and palpitations. He reports he has never had palpitations before. He reports his symptoms began around 12 PM and lasted for about 10 minutes. He is currently symptom free. He denies any shortness of breath with his symptoms. The patient does report increased burping and belching since yesterday. Patient reports he quit smoking 7-8 months ago. He denies any recent long travel. He denies personal history of PE, DVT or MI. He is adopted and unaware of his family history. He denies fevers, coughing, shortness of breath, syncope, lightheadedness, dizziness, abdominal pain, nausea, vomiting, diarrhea, rashes.  The history is provided by the patient. No language interpreter was used.    Past Medical History  Diagnosis Date  . Acid reflux    History reviewed. No pertinent past surgical history. No family history on file. Social History  Substance Use Topics  . Smoking status: Former Games developer  . Smokeless tobacco: None  . Alcohol Use: Yes     Comment: occ    Review of Systems  Constitutional: Negative for fever and chills.  HENT: Negative for congestion and sore throat.   Eyes: Negative for visual disturbance.  Respiratory: Negative for cough, shortness of breath and wheezing.   Cardiovascular: Positive for chest pain and palpitations. Negative for leg swelling.  Gastrointestinal: Negative for nausea, vomiting, abdominal pain and diarrhea.  Genitourinary: Negative for dysuria.  Musculoskeletal: Negative for back pain and neck pain.  Skin: Negative for rash.  Neurological: Negative for light-headedness and headaches.       Allergies  Review of patient's allergies indicates no known allergies.  Home Medications   Prior to Admission medications   Medication Sig Start Date End Date Taking? Authorizing Provider  methocarbamol (ROBAXIN) 500 MG tablet Take 1 tablet (500 mg total) by mouth 2 (two) times daily. Patient taking differently: Take 500 mg by mouth 2 (two) times daily as needed for muscle spasms.  04/09/16  Yes Melton Krebs, PA-C  naproxen (NAPROSYN) 500 MG tablet Take 1 tablet (500 mg total) by mouth 2 (two) times daily. Patient taking differently: Take 500 mg by mouth 2 (two) times daily as needed for mild pain or moderate pain.  04/09/16  Yes Melton Krebs, PA-C  omeprazole (PRILOSEC) 20 MG capsule Take 1 capsule (20 mg total) by mouth daily. 04/17/16   Everlene Farrier, PA-C   BP 127/83 mmHg  Pulse 74  Temp(Src) 98.2 F (36.8 C) (Oral)  Resp 19  SpO2 96% Physical Exam  Constitutional: He is oriented to person, place, and time. He appears well-developed and well-nourished. No distress.  Nontoxic appearing.  HENT:  Head: Normocephalic and atraumatic.  Mouth/Throat: Oropharynx is clear and moist.  Eyes: Conjunctivae are normal. Pupils are equal, round, and reactive to light. Right eye exhibits no discharge. Left eye exhibits no discharge.  Neck: Normal range of motion. Neck supple. No JVD present. No tracheal deviation present.  Cardiovascular: Normal rate, regular rhythm, normal heart sounds and intact distal pulses.  Exam reveals no gallop and no friction rub.   No murmur heard. Bilateral radial, posterior tibialis and dorsalis  pedis pulses are intact.    Pulmonary/Chest: Effort normal and breath sounds normal. No stridor. No respiratory distress. He has no wheezes. He has no rales.  Lungs clear to auscultation bilaterally.  Abdominal: Soft. Bowel sounds are normal. There is no tenderness. There is no guarding.  Musculoskeletal: He exhibits no edema or tenderness.  No lower  extremity edema or tenderness.  Lymphadenopathy:    He has no cervical adenopathy.  Neurological: He is alert and oriented to person, place, and time. Coordination normal.  Skin: Skin is warm and dry. No rash noted. He is not diaphoretic. No erythema. No pallor.  Psychiatric: He has a normal mood and affect. His behavior is normal.  Nursing note and vitals reviewed.   ED Course  Procedures (including critical care time) Labs Review Labs Reviewed  BASIC METABOLIC PANEL - Abnormal; Notable for the following:    Glucose, Bld 122 (*)    All other components within normal limits  CBC - Abnormal; Notable for the following:    WBC 13.6 (*)    All other components within normal limits  Rosezena Sensor, ED    Imaging Review Dg Chest 2 View  04/17/2016  CLINICAL DATA:  Chest pain for 7 months. History of acid reflux. Ex-smoker. EXAM: CHEST  2 VIEW COMPARISON:  Chest x-ray dated 04/08/2015. FINDINGS: Heart size is normal. Overall cardiomediastinal silhouette is stable in size and configuration. Lungs are clear. Lung volumes are normal. No pleural effusion or pneumothorax seen. Osseous structures about the chest are unremarkable. IMPRESSION: No active cardiopulmonary disease. Electronically Signed   By: Bary Richard M.D.   On: 04/17/2016 13:39   I have personally reviewed and evaluated these images and lab results as part of my medical decision-making.   EKG Interpretation   Date/Time:  Saturday April 17 2016 13:21:52 EDT Ventricular Rate:  88 PR Interval:    QRS Duration: 87 QT Interval:  366 QTC Calculation: 456 R Axis:   41 Text Interpretation:  Sinus or ectopic atrial rhythm Atrial premature  complex No significant change was found Confirmed by CAMPOS  MD, KEVIN  ([id number]) on 04/17/2016 2:17:02 PM      Filed Vitals:   04/17/16 1326 04/17/16 1330 04/17/16 1455  BP:  144/106 127/83  Pulse:  88 74  Temp:  98.2 F (36.8 C)   TempSrc:  Oral   Resp:  14 19  SpO2: 99% 100% 96%      MDM   Meds given in ED:  Medications - No data to display  New Prescriptions   OMEPRAZOLE (PRILOSEC) 20 MG CAPSULE    Take 1 capsule (20 mg total) by mouth daily.    Final diagnoses:  Chest pain, unspecified chest pain type   This is a 45 y.o. male who presents to the ED Complaining of having intermittent chest tightness ongoing for the past 6-7 months. Patient reports today he was sitting in his car when he began having chest tightness and palpitations. He reports he has never had palpitations before. He reports his symptoms began around 12 PM and lasted for about 10 minutes. He is currently symptom free. He denies any shortness of breath with his symptoms. The patient does report increased burping and belching since yesterday. Patient reports he quit smoking 7-8 months ago. He denies any recent long travel. He denies personal history of PE, DVT or MI.  Patient presented with chest pain to the ED. He has been chest pain-free while in the emergency department. Patient is  to be discharged with recommendation to follow up with PCP in regards to today's hospital visit. Chest pain is not likely of cardiac or pulmonary etiology due to presentation, PERC and Wells' negative, VSS, no tracheal deviation, no JVD or new murmur, RRR, breath sounds equal bilaterally, EKG without acute abnormalities, negative troponin, and negative CXR. HEART score is 2. Patient has been advised to return to the ED if chest pain becomes exertional, associated with diaphoresis or nausea, radiates to left jaw/arm, worsens or becomes concerning in any way. Patient does report he has had increased burping and belching. He reports he has used a PPI intermittently previously. Will have him complete at least a two-week course of omeprazole to see if this could help with his symptoms. Patient appears reliable for follow up and is agreeable to discharge. I advised the patient to follow-up with their primary care provider this week. I  also encouraged him to speak to his primary care doctor about obtaining cardiac stress test. I advised the patient to return to the emergency department with new or worsening symptoms or new concerns. The patient verbalized understanding and agreement with plan.         Everlene Farrier, PA-C 04/17/16 1505  Mancel Bale, MD 04/17/16 [phone]

## 2016-04-17 NOTE — Discharge Instructions (Signed)
Nonspecific Chest Pain  °Chest pain can be caused by many different conditions. There is always a chance that your pain could be related to something serious, such as a heart attack or a blood clot in your lungs. Chest pain can also be caused by conditions that are not life-threatening. If you have chest pain, it is very important to follow up with your health care provider. °CAUSES  °Chest pain can be caused by: °· Heartburn. °· Pneumonia or bronchitis. °· Anxiety or stress. °· Inflammation around your heart (pericarditis) or lung (pleuritis or pleurisy). °· A blood clot in your lung. °· A collapsed lung (pneumothorax). It can develop suddenly on its own (spontaneous pneumothorax) or from trauma to the chest. °· Shingles infection (varicella-zoster virus). °· Heart attack. °· Damage to the bones, muscles, and cartilage that make up your chest wall. This can include: °· Bruised bones due to injury. °· Strained muscles or cartilage due to frequent or repeated coughing or overwork. °· Fracture to one or more ribs. °· Sore cartilage due to inflammation (costochondritis). °RISK FACTORS  °Risk factors for chest pain may include: °· Activities that increase your risk for trauma or injury to your chest. °· Respiratory infections or conditions that cause frequent coughing. °· Medical conditions or overeating that can cause heartburn. °· Heart disease or family history of heart disease. °· Conditions or health behaviors that increase your risk of developing a blood clot. °· Having had chicken pox (varicella zoster). °SIGNS AND SYMPTOMS °Chest pain can feel like: °· Burning or tingling on the surface of your chest or deep in your chest. °· Crushing, pressure, aching, or squeezing pain. °· Dull or sharp pain that is worse when you move, cough, or take a deep breath. °· Pain that is also felt in your back, neck, shoulder, or arm, or pain that spreads to any of these areas. °Your chest pain may come and go, or it may stay  constant. °DIAGNOSIS °Lab tests or other studies may be needed to find the cause of your pain. Your health care provider may have you take a test called an ambulatory ECG (electrocardiogram). An ECG records your heartbeat patterns at the time the test is performed. You may also have other tests, such as: °· Transthoracic echocardiogram (TTE). During echocardiography, sound waves are used to create a picture of all of the heart structures and to look at how blood flows through your heart. °· Transesophageal echocardiogram (TEE). This is a more advanced imaging test that obtains images from inside your body. It allows your health care provider to see your heart in finer detail. °· Cardiac monitoring. This allows your health care provider to monitor your heart rate and rhythm in real time. °· Holter monitor. This is a portable device that records your heartbeat and can help to diagnose abnormal heartbeats. It allows your health care provider to track your heart activity for several days, if needed. °· Stress tests. These can be done through exercise or by taking medicine that makes your heart beat more quickly. °· Blood tests. °· Imaging tests. °TREATMENT  °Your treatment depends on what is causing your chest pain. Treatment may include: °· Medicines. These may include: °· Acid blockers for heartburn. °· Anti-inflammatory medicine. °· Pain medicine for inflammatory conditions. °· Antibiotic medicine, if an infection is present. °· Medicines to dissolve blood clots. °· Medicines to treat coronary artery disease. °· Supportive care for conditions that do not require medicines. This may include: °· Resting. °· Applying heat   or cold packs to injured areas. °· Limiting activities until pain decreases. °HOME CARE INSTRUCTIONS °· If you were prescribed an antibiotic medicine, finish it all even if you start to feel better. °· Avoid any activities that bring on chest pain. °· Do not use any tobacco products, including  cigarettes, chewing tobacco, or electronic cigarettes. If you need help quitting, ask your health care provider. °· Do not drink alcohol. °· Take medicines only as directed by your health care provider. °· Keep all follow-up visits as directed by your health care provider. This is important. This includes any further testing if your chest pain does not go away. °· If heartburn is the cause for your chest pain, you may be told to keep your head raised (elevated) while sleeping. This reduces the chance that acid will go from your stomach into your esophagus. °· Make lifestyle changes as directed by your health care provider. These may include: °¨ Getting regular exercise. Ask your health care provider to suggest some activities that are safe for you. °¨ Eating a heart-healthy diet. A registered dietitian can help you to learn healthy eating options. °¨ Maintaining a healthy weight. °¨ Managing diabetes, if necessary. °¨ Reducing stress. °SEEK MEDICAL CARE IF: °· Your chest pain does not go away after treatment. °· You have a rash with blisters on your chest. °· You have a fever. °SEEK IMMEDIATE MEDICAL CARE IF:  °· Your chest pain is worse. °· You have an increasing cough, or you cough up blood. °· You have severe abdominal pain. °· You have severe weakness. °· You faint. °· You have chills. °· You have sudden, unexplained chest discomfort. °· You have sudden, unexplained discomfort in your arms, back, neck, or jaw. °· You have shortness of breath at any time. °· You suddenly start to sweat, or your skin gets clammy. °· You feel nauseous or you vomit. °· You suddenly feel light-headed or dizzy. °· Your heart begins to beat quickly, or it feels like it is skipping beats. °These symptoms may represent a serious problem that is an emergency. Do not wait to see if the symptoms will go away. Get medical help right away. Call your local emergency services (911 in the [place] Do not drive yourself to the hospital. °  °This  information is not intended to replace advice given to you by your health care provider. Make sure you discuss any questions you have with your health care provider. °  °Document Released: 06/23/2005 Document Revised: 10/04/2014 Document Reviewed: 04/19/2014 °Elsevier Interactive Patient Education ©2016 [employer] ° °Gastroesophageal Reflux Disease, Adult °Normally, food travels down the esophagus and stays in the stomach to be digested. However, when a person has gastroesophageal reflux disease (GERD), food and stomach acid move back up into the esophagus. When this happens, the esophagus becomes sore and inflamed. Over time, GERD can create small holes (ulcers) in the lining of the esophagus.  °CAUSES °This condition is caused by a problem with the muscle between the esophagus and the stomach (lower esophageal sphincter, or LES). Normally, the LES muscle closes after food passes through the esophagus to the stomach. When the LES is weakened or abnormal, it does not close properly, and that allows food and stomach acid to go back up into the esophagus. The LES can be weakened by certain dietary substances, medicines, and medical conditions, including: °· Tobacco use. °· Pregnancy. °· Having a hiatal hernia. °· Heavy alcohol use. °· Certain foods and beverages, such as coffee,   chocolate, onions, and peppermint. °RISK FACTORS °This condition is more likely to develop in: °· People who have an increased body weight. °· People who have connective tissue disorders. °· People who use NSAID medicines. °SYMPTOMS °Symptoms of this condition include: °· Heartburn. °· Difficult or painful swallowing. °· The feeling of having a lump in the throat. °· A bitter taste in the mouth. °· Bad breath. °· Having a large amount of saliva. °· Having an upset or bloated stomach. °· Belching. °· Chest pain. °· Shortness of breath or wheezing. °· Ongoing (chronic) cough or a night-time cough. °· Wearing away of tooth enamel. °· Weight  loss. °Different conditions can cause chest pain. Make sure to see your health care provider if you experience chest pain. °DIAGNOSIS °Your health care provider will take a medical history and perform a physical exam. To determine if you have mild or severe GERD, your health care provider may also monitor how you respond to treatment. You may also have other tests, including: °· An endoscopy to examine your stomach and esophagus with a small camera. °· A test that measures the acidity level in your esophagus. °· A test that measures how much pressure is on your esophagus. °· A barium swallow or modified barium swallow to show the shape, size, and functioning of your esophagus. °TREATMENT °The goal of treatment is to help relieve your symptoms and to prevent complications. Treatment for this condition may vary depending on how severe your symptoms are. Your health care provider may recommend: °· Changes to your diet. °· Medicine. °· Surgery. °HOME CARE INSTRUCTIONS °Diet °· Follow a diet as recommended by your health care provider. This may involve avoiding foods and drinks such as: °¨ Coffee and tea (with or without caffeine). °¨ Drinks that contain alcohol. °¨ Energy drinks and sports drinks. °¨ Carbonated drinks or sodas. °¨ Chocolate and cocoa. °¨ Peppermint and mint flavorings. °¨ Garlic and onions. °¨ Horseradish. °¨ Spicy and acidic foods, including peppers, chili powder, curry powder, vinegar, hot sauces, and barbecue sauce. °¨ Citrus fruit juices and citrus fruits, such as oranges, lemons, and limes. °¨ Tomato-based foods, such as red sauce, chili, salsa, and pizza with red sauce. °¨ Fried and fatty foods, such as donuts, french fries, potato chips, and high-fat dressings. °¨ High-fat meats, such as hot dogs and fatty cuts of red and white meats, such as rib eye steak, sausage, ham, and bacon. °¨ High-fat dairy items, such as whole milk, butter, and cream cheese. °· Eat small, frequent meals instead of large  meals. °· Avoid drinking large amounts of liquid with your meals. °· Avoid eating meals during the 2-3 hours before bedtime. °· Avoid lying down right after you eat. °· Do not exercise right after you eat. ° General Instructions  °· Pay attention to any changes in your symptoms. °· Take over-the-counter and prescription medicines only as told by your health care provider. Do not take aspirin, ibuprofen, or other NSAIDs unless your health care provider told you to do so. °· Do not use any tobacco products, including cigarettes, chewing tobacco, and e-cigarettes. If you need help quitting, ask your health care provider. °· Wear loose-fitting clothing. Do not wear anything tight around your waist that causes pressure on your abdomen. °· Raise (elevate) the head of your bed 6 inches (15cm). °· Try to reduce your stress, such as with yoga or meditation. If you need help reducing stress, ask your health care provider. °· If you are overweight, reduce your weight to an amount that is   healthy for you. Ask your health care provider for guidance about a safe weight loss goal. °· Keep all follow-up visits as told by your health care provider. This is important. °SEEK MEDICAL CARE IF: °· You have new symptoms. °· You have unexplained weight loss. °· You have difficulty swallowing, or it hurts to swallow. °· You have wheezing or a persistent cough. °· Your symptoms do not improve with treatment. °· You have a hoarse voice. °SEEK IMMEDIATE MEDICAL CARE IF: °· You have pain in your arms, neck, jaw, teeth, or back. °· You feel sweaty, dizzy, or light-headed. °· You have chest pain or shortness of breath. °· You vomit and your vomit looks like blood or coffee grounds. °· You faint. °· Your stool is bloody or black. °· You cannot swallow, drink, or eat. °  °This information is not intended to replace advice given to you by your health care provider. Make sure you discuss any questions you have with your health care provider. °    °Document Released: 06/23/2005 Document Revised: 06/04/2015 Document Reviewed: 01/08/2015 °Elsevier Interactive Patient Education ©2016 [employer] ° °

## 2016-04-17 NOTE — ED Notes (Signed)
Per patient, he has been having chest pain for 6-7 months.  He states the pain gets tight in his chest.  He stopped smoking 7-8 ago.  Denies dizzy or light headness.  Patient states he felt a lump on his left chin (not sure if this is related).

## 2016-10-17 ENCOUNTER — Encounter (HOSPITAL_COMMUNITY): Payer: Self-pay | Admitting: Emergency Medicine

## 2016-10-17 ENCOUNTER — Emergency Department (HOSPITAL_COMMUNITY)
Admission: EM | Admit: 2016-10-17 | Discharge: 2016-10-17 | Disposition: A | Discharge status: Home / Self Care | Payer: Managed Care, Other (non HMO) | Attending: Emergency Medicine | Admitting: Emergency Medicine

## 2016-10-17 DIAGNOSIS — Z87891 Personal history of nicotine dependence: Secondary | ICD-10-CM | POA: Insufficient documentation

## 2016-10-17 DIAGNOSIS — Y999 Unspecified external cause status: Secondary | ICD-10-CM | POA: Diagnosis not present

## 2016-10-17 DIAGNOSIS — Y92410 Unspecified street and highway as the place of occurrence of the external cause: Secondary | ICD-10-CM | POA: Diagnosis not present

## 2016-10-17 DIAGNOSIS — Y939 Activity, unspecified: Secondary | ICD-10-CM | POA: Diagnosis not present

## 2016-10-17 DIAGNOSIS — M545 Low back pain, unspecified: Secondary | ICD-10-CM

## 2016-10-17 DIAGNOSIS — M549 Dorsalgia, unspecified: Secondary | ICD-10-CM | POA: Diagnosis present

## 2016-10-17 MED ORDER — CYCLOBENZAPRINE HCL 10 MG PO TABS: 10.0000 mg | ORAL_TABLET | Freq: Two times a day (BID) | ORAL | 0 refills | Status: DC | PRN

## 2016-10-17 MED ORDER — IBUPROFEN 800 MG PO TABS: 800.0000 mg | ORAL_TABLET | Freq: Three times a day (TID) | ORAL | 0 refills | Status: DC
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
## 2017-04-09 ENCOUNTER — Emergency Department (HOSPITAL_COMMUNITY): Payer: Managed Care, Other (non HMO)

## 2017-04-09 ENCOUNTER — Encounter (HOSPITAL_COMMUNITY): Payer: Self-pay | Admitting: Oncology

## 2017-04-09 ENCOUNTER — Emergency Department (HOSPITAL_COMMUNITY)
Admission: EM | Admit: 2017-04-09 | Discharge: 2017-04-09 | Disposition: A | Discharge status: Home / Self Care | Payer: Managed Care, Other (non HMO) | Attending: Emergency Medicine | Admitting: Emergency Medicine

## 2017-04-09 DIAGNOSIS — R0789 Other chest pain: Secondary | ICD-10-CM | POA: Insufficient documentation

## 2017-04-09 DIAGNOSIS — Z87891 Personal history of nicotine dependence: Secondary | ICD-10-CM | POA: Diagnosis not present

## 2017-04-09 LAB — BASIC METABOLIC PANEL
ANION GAP: 6 (ref 5–15)
BUN: 10 mg/dL (ref 6–20)
CO2: 29 mmol/L (ref 22–32)
Calcium: 8.9 mg/dL (ref 8.9–10.3)
Chloride: 106 mmol/L (ref 101–111)
Creatinine, Ser: 1.02 mg/dL (ref 0.61–1.24)
GFR calc Af Amer: >60 mL/min (ref >60)
Glucose, Bld: 97 mg/dL (ref 65–99)
POTASSIUM: 4.3 mmol/L (ref 3.5–5.1)
SODIUM: 141 mmol/L (ref 135–145)

## 2017-04-09 LAB — CBC
HEMATOCRIT: 43.6 % (ref 39.0–52.0)
HEMOGLOBIN: 15.1 g/dL (ref 13.0–17.0)
MCH: 28.5 pg (ref 26.0–34.0)
MCHC: 34.6 g/dL (ref 30.0–36.0)
MCV: 82.3 fL (ref 78.0–100.0)
Platelets: 304 10*3/uL (ref 150–400)
RBC: 5.3 MIL/uL (ref 4.22–5.81)
RDW: 13.8 % (ref 11.5–15.5)
WBC: 14.1 10*3/uL — AB (ref 4.0–10.5)

## 2017-04-09 LAB — POCT I-STAT TROPONIN I
TROPONIN I, POC: 0 ng/mL (ref 0.00–0.08)
Troponin i, poc: 0.01 ng/mL (ref 0.00–0.08)

## 2017-04-09 LAB — D-DIMER, QUANTITATIVE (NOT AT ARMC)

## 2017-04-09 MED ORDER — NAPROXEN 500 MG PO TABS: ORAL_TABLET | ORAL | 0 refills | Status: DC

## 2017-04-09 MED ORDER — METHOCARBAMOL 500 MG PO TABS: ORAL_TABLET | ORAL | 0 refills | Status: DC

## 2017-04-09 MED ORDER — NAPROXEN 500 MG PO TABS
500.0000 mg | ORAL_TABLET | Freq: Once | ORAL | Status: AC
  Administered 2017-04-09: 500 mg via ORAL
  Filled 2017-04-09: qty 1

## 2017-04-09 MED ORDER — METHOCARBAMOL 500 MG PO TABS
1000.0000 mg | ORAL_TABLET | Freq: Once | ORAL | Status: AC
  Administered 2017-04-09: 1000 mg via ORAL
  Filled 2017-04-09: qty 2

## 2017-04-09 NOTE — Discharge Instructions (Signed)
Use heat or ice for comfort. Take the medications as needed for your chest wall pain. Recheck as needed.
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
## 2017-04-09 NOTE — ED Provider Notes (Signed)
[facility]-EMERGENCY DEPT Provider Note   CSN: [id number] Arrival date & time: 04/09/17  0009  By signing my name below, I, Ny'Kea Lewis, attest that this documentation has been prepared under the direction and in the presence of Devoria Albe, MD. Electronically Signed: Karren Cobble, ED Scribe. 04/09/17. 4:10 AM.  Time seen 03:40 AM   History   Chief Complaint Chief Complaint  Patient presents with  . Chest Pain   The history is provided by the patient. No language interpreter was used.    HPI Comments: Paul Huff is a 46 y.o. male with no pertinent history, who presents to the Emergency Department complaining of sudden onset, persistent, non-radiating, achy centralized chest pain that began at 7 pm, he rates the severity of his pain a 7/10. Pt notes  Associated shortness of breath with exertion that he noticed walking from his car into the ED. He reports an onset of mid-sternum chest pain that began round 7 pm last night. Reports his pain is improved with stretching. Nothing makes the pain worse. Denies a hx of similar pain. No recently heavy lifting or change in activity or trauma reported. No h/o PE/DVT or recent long travel. Denies tobacco usage. Reports occasional alcohol usage. He notes tonight he had "one screw driver" alcoholic drink last night that he has had before. Denies nausea, diaphoresis, cough, sore throat, rhinorrhea, and peripheral edema. He denies having this pain before. He is unaware of any family history of heart disease because he is adopted. He denies any other risk factors such as hypertension or diabetes. He denies any pain or swelling in his legs. He has not had any recent travel.  PCP none   Past Medical History:  Diagnosis Date  . Acid reflux    There are no active problems to display for this patient.  History reviewed. No pertinent surgical history.  Home Medications    He states he only takes Nexium  Prior to Admission medications   Medication Sig Start  Date End Date Taking? Authorizing Provider  cyclobenzaprine (FLEXERIL) 10 MG tablet Take 1 tablet (10 mg total) by mouth 2 (two) times daily as needed for muscle spasms. Patient not taking: Reported on 04/09/2017 10/17/16   Emi Holes, PA-C  ibuprofen (ADVIL,MOTRIN) 800 MG tablet Take 1 tablet (800 mg total) by mouth 3 (three) times daily. Patient not taking: Reported on 04/09/2017 10/17/16   Emi Holes, PA-C  methocarbamol (ROBAXIN) 500 MG tablet Take 1 or 2 po Q 6hrs for pain 04/09/17   Devoria Albe, MD  naproxen (NAPROSYN) 500 MG tablet Take 1 po BID with food prn pain 04/09/17   Devoria Albe, MD  omeprazole (PRILOSEC) 20 MG capsule Take 1 capsule (20 mg total) by mouth daily. Patient not taking: Reported on 04/09/2017 04/17/16   Everlene Farrier, PA-C   Family History No family history on file.  Social History Social History  Substance Use Topics  . Smoking status: Former Games developer  . Smokeless tobacco: Never Used  . Alcohol use Yes     Comment: occ   employed  Allergies   Patient has no known allergies.  Review of Systems Review of Systems  Constitutional: Negative for diaphoresis.  HENT: Negative for rhinorrhea and sore throat.   Respiratory: Negative for cough.   Cardiovascular: Positive for chest pain. Negative for leg swelling.  Gastrointestinal: Negative for nausea.  All other systems reviewed and are negative.  Physical Exam Updated Vital Signs Ht 5' 11.5" (1.816 m)   Wt  250 lb (113.4 kg)   BMI 34.38 kg/m  ED Triage Vitals  Enc Vitals Group     BP 04/09/17 0430 129/90     Pulse Rate 04/09/17 0450 64     Resp 04/09/17 0450 13     Temp 04/09/17 0450 98 F (36.7 C)     Temp Source 04/09/17 0450 Oral     SpO2 04/09/17 0450 100 %     Weight 04/09/17 0024 250 lb (113.4 kg)     Height 04/09/17 0024 5' 11.5" (1.816 m)     Head Circumference --      Peak Flow --      Pain Score 04/09/17 0023 9     Pain Loc --      Pain Edu? --      Excl. in GC? --      Vital  signs normal    Physical Exam  Constitutional: He is oriented to person, place, and time. He appears well-developed and well-nourished.  Non-toxic appearance. He does not appear ill. No distress.  HENT:  Head: Normocephalic and atraumatic.  Right Ear: External ear normal.  Left Ear: External ear normal.  Nose: Nose normal. No mucosal edema or rhinorrhea.  Mouth/Throat: Oropharynx is clear and moist and mucous membranes are normal. No dental abscesses or uvula swelling.  Eyes: Pupils are equal, round, and reactive to light. Conjunctivae and EOM are normal.  Neck: Normal range of motion and full passive range of motion without pain. Neck supple.  Cardiovascular: Normal rate, regular rhythm and normal heart sounds.  Exam reveals no gallop and no friction rub.   No murmur heard. Pulmonary/Chest: Effort normal and breath sounds normal. No respiratory distress. He has no wheezes. He has no rhonchi. He has no rales. He exhibits no tenderness and no crepitus.    Non-tender over the costochondral junctions on either sides, but is TTP over the lower sternum without crepitus. Area of pain noted.  Abdominal: Soft. Normal appearance and bowel sounds are normal. He exhibits no distension. There is no tenderness. There is no rebound and no guarding.  Musculoskeletal: Normal range of motion. He exhibits no edema or tenderness.  Moves all extremities well.   Neurological: He is alert and oriented to person, place, and time. He has normal strength. No cranial nerve deficit.  Skin: Skin is warm, dry and intact. No rash noted. No erythema. No pallor.  Psychiatric: He has a normal mood and affect. His speech is normal and behavior is normal. His mood appears not anxious.  Nursing note and vitals reviewed.  ED Treatments / Results  DIAGNOSTIC STUDIES:  Labs (all labs ordered are listed, but only abnormal results are displayed) Results for orders placed or performed during the hospital encounter of 04/09/17   Basic metabolic panel  Result Value Ref Range   Sodium 141 135 - 145 mmol/L   Potassium 4.3 3.5 - 5.1 mmol/L   Chloride 106 101 - 111 mmol/L   CO2 29 22 - 32 mmol/L   Glucose, Bld 97 65 - 99 mg/dL   BUN 10 6 - 20 mg/dL   Creatinine, Ser 1.611.02 0.61 - 1.24 mg/dL   Calcium 8.9 8.9 - 09.610.3 mg/dL   GFR calc non Af Amer >60 >60 mL/min   GFR calc Af Amer >60 >60 mL/min   Anion gap 6 5 - 15  CBC  Result Value Ref Range   WBC 14.1 (H) 4.0 - 10.5 K/uL   RBC 5.30 4.22 - 5.81  MIL/uL   Hemoglobin 15.1 13.0 - 17.0 g/dL   HCT 69.6 29.5 - 28.4 %   MCV 82.3 78.0 - 100.0 fL   MCH 28.5 26.0 - 34.0 pg   MCHC 34.6 30.0 - 36.0 g/dL   RDW 13.2 44.0 - 10.2 %   Platelets 304 150 - 400 K/uL  D-dimer, quantitative  Result Value Ref Range   D-Dimer, Quant <0.27 0.00 - 0.50 ug/mL-FEU  POCT i-Stat troponin I  Result Value Ref Range   Troponin i, poc 0.01 0.00 - 0.08 ng/mL   Comment 3          POCT i-Stat troponin I  Result Value Ref Range   Troponin i, poc 0.00 0.00 - 0.08 ng/mL   Comment 3           Laboratory interpretation all normal except leukocytosis   EKG  EKG Interpretation  Date/Time:  Saturday April 09 2017 00:20:08 EDT Ventricular Rate:  78 PR Interval:    QRS Duration: 89 QT Interval:  394 QTC Calculation: 449 R Axis:   57 Text Interpretation:  Sinus rhythm ST elev, probable normal early repol pattern No significant change since last tracing 17 Apr 2016 Confirmed by Devoria Albe ([id number]) on 04/09/2017 12:38:35 AM       Radiology Dg Chest 2 View  Result Date: 04/09/2017 CLINICAL DATA:  Central chest pain EXAM: CHEST  2 VIEW COMPARISON:  04/17/2016 FINDINGS: The heart size and mediastinal contours are within normal limits. Both lungs are clear. The visualized skeletal structures are unremarkable. IMPRESSION: No active cardiopulmonary disease. Electronically Signed   By: Jasmine Pang M.D.   On: 04/09/2017 00:54    Procedures Procedures (including critical care time)  Medications  Ordered in ED Medications  naproxen (NAPROSYN) tablet 500 mg (500 mg Oral Given 04/09/17 0412)  methocarbamol (ROBAXIN) tablet 1,000 mg (1,000 mg Oral Given 04/09/17 0412)     Initial Impression / Assessment and Plan / ED Course  I have reviewed the triage vital signs and the nursing notes.  Pertinent labs & imaging results that were available during my care of the patient were reviewed by me and considered in my medical decision making (see chart for details).     COORDINATION OF CARE: 3:48 AM-Discussed next steps with pt. Pt verbalized understanding and is agreeable with the plan. D-dimer was added to do a screen for blood clot. His risk factor is that he sits a lot for work.  Recheck at 5 AM patient states his pain is much improved. His second delta troponin has not resulted yet. However we discussed when it returns he will be discharged home with similar medications.   Final Clinical Impressions(s) / ED Diagnoses   Final diagnoses:  Chest wall pain    New Prescriptions New Prescriptions   METHOCARBAMOL (ROBAXIN) 500 MG TABLET    Take 1 or 2 po Q 6hrs for pain   NAPROXEN (NAPROSYN) 500 MG TABLET    Take 1 po BID with food prn pain    Plan discharge  Devoria Albe, MD, FACEP   I personally performed the services described in this documentation, which was scribed in my presence. The recorded information has been reviewed and considered.  Devoria Albe, MD, Concha Pyo, MD 04/09/17 [phone]

## 2017-05-03 ENCOUNTER — Ambulatory Visit (INDEPENDENT_AMBULATORY_CARE_PROVIDER_SITE_OTHER)
Admission: RE | Admit: 2017-05-03 | Discharge: 2017-05-03 | Disposition: A | Discharge status: Home / Self Care | Payer: Managed Care, Other (non HMO) | Source: Ambulatory Visit | Attending: Adult Health | Admitting: Adult Health

## 2017-05-03 ENCOUNTER — Encounter: Payer: Self-pay | Admitting: Adult Health

## 2017-05-03 ENCOUNTER — Ambulatory Visit (INDEPENDENT_AMBULATORY_CARE_PROVIDER_SITE_OTHER): Payer: Managed Care, Other (non HMO) | Admitting: Adult Health

## 2017-05-03 VITALS — BP 130/90 | Temp 97.9°F | Ht 69.5 in | Wt 268.0 lb

## 2017-05-03 DIAGNOSIS — Z23 Encounter for immunization: Secondary | ICD-10-CM | POA: Diagnosis not present

## 2017-05-03 DIAGNOSIS — M5441 Lumbago with sciatica, right side: Secondary | ICD-10-CM | POA: Diagnosis not present

## 2017-05-03 DIAGNOSIS — Z7689 Persons encountering health services in other specified circumstances: Secondary | ICD-10-CM | POA: Diagnosis not present

## 2017-05-03 DIAGNOSIS — G8929 Other chronic pain: Secondary | ICD-10-CM

## 2017-05-03 NOTE — Patient Instructions (Signed)
It was a pleasure meeting you today   Please follow up with me for your complete physical   [facility]   If you need anything in the meantime, please let me know

## 2017-05-03 NOTE — Addendum Note (Signed)
Addended by: Nancy FetterNAFZIGER, Tafari Humiston L on: 05/03/2017 02:28 PM   Modules accepted: Orders

## 2017-05-03 NOTE — Progress Notes (Addendum)
Patient presents to clinic today to establish care. He is a pleasant 46 year old male who  has a past medical history of Acid reflux and Sciatica.    Acute Concerns: Establish Care   Chronic Issues: Low back pain/Sciatica Pain - this has been chronic issue for him. He gets severe sciatica pain greater than once a month. He reports playing high school and some college football and contributes this to his low back pain  Health Maintenance: Dental --Routine Care Vision -- Routine Care  Immunizations -- Needs Tdap  Colonoscopy -- Never had one  Diet: Tries to eat healthy Exercise: Likes to play golf walk on treadmill.    Past Medical History:  Diagnosis Date  . Acid reflux   . Sciatica     Past Surgical History:  Procedure Laterality Date  . TONSILLECTOMY  1985    No current outpatient prescriptions on file prior to visit.   No current facility-administered medications on file prior to visit.     No Known Allergies  Family History  Problem Relation Age of Onset  . Adopted: Yes    Social History   Social History  . Marital status: Single    Spouse name: N/A  . Number of children: N/A  . Years of education: N/A   Occupational History  . Not on file.   Social History Main Topics  . Smoking status: Former Games developer  . Smokeless tobacco: Never Used  . Alcohol use 4.8 oz/week    8 Shots of liquor per week     Comment: social drinking on weekend  . Drug use: Yes    Types: Marijuana  . Sexual activity: Not Currently   Other Topics Concern  . Not on file   Social History Narrative   Administrator, Civil Service    Married    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     Review of Systems  Constitutional: Negative.   HENT: Negative.   Eyes: Negative.   Respiratory: Negative.   Cardiovascular: Negative.   Gastrointestinal: Negative.   Genitourinary: Negative.   Musculoskeletal: Positive for back pain.  Skin: Negative.     Neurological: Negative.   Endo/Heme/Allergies: Negative.   Psychiatric/Behavioral: Negative.   All other systems reviewed and are negative.   BP 130/90 (BP Location: Left Arm)   Temp 97.9 F (36.6 C) (Oral)   Ht 5' 9.5" (1.765 m)   Wt 268 lb (121.6 kg)   BMI 39.01 kg/m   Physical Exam  Constitutional: He is oriented to person, place, and time and well-developed, well-nourished, and in no distress. No distress.  HENT:  Head: Normocephalic and atraumatic.  Right Ear: External ear normal.  Left Ear: External ear normal.  Nose: Nose normal.  Mouth/Throat: Oropharynx is clear and moist.  Eyes: Pupils are equal, round, and reactive to light. Conjunctivae and EOM are normal. Right eye exhibits no discharge. Left eye exhibits no discharge. No scleral icterus.  Cardiovascular: Normal rate, regular rhythm, normal heart sounds and intact distal pulses.  Exam reveals no gallop and no friction rub.   No murmur heard. Pulmonary/Chest: Effort normal and breath sounds normal. No respiratory distress. He has no wheezes. He has no rales. He exhibits no tenderness.  Neurological: He is alert and oriented to person, place, and time. Gait normal. GCS score is 15.  Skin: Skin is warm and dry. No rash noted. He is not diaphoretic. No erythema.  No pallor.  Psychiatric: Mood, memory, affect and judgment normal.  Nursing note and vitals reviewed.    Assessment/Plan: 1. Encounter to establish care - Follow up for CPE  - Work on diet and exercise to lose weight  - Tdap given    2. Chronic bilateral low back pain with right-sided sciatica - DG Lumbar Spine Complete; Future - Probable referral to orthopedics  - Weight loss will help   3. Need for diphtheria-tetanus-pertussis (Tdap) vaccine - Tdap vaccine greater than or equal to 7yo IM   Shirline Freesory Rayley Gao, NP

## 2017-05-05 ENCOUNTER — Other Ambulatory Visit: Payer: Self-pay | Admitting: Family Medicine

## 2017-05-05 DIAGNOSIS — M5136 Other intervertebral disc degeneration, lumbar region: Secondary | ICD-10-CM

## 2017-05-10 ENCOUNTER — Encounter: Payer: Self-pay | Admitting: Adult Health

## 2017-05-10 ENCOUNTER — Ambulatory Visit (INDEPENDENT_AMBULATORY_CARE_PROVIDER_SITE_OTHER): Payer: Managed Care, Other (non HMO) | Admitting: Adult Health

## 2017-05-10 VITALS — BP 130/86 | Temp 98.0°F | Ht 69.0 in | Wt 259.0 lb

## 2017-05-10 DIAGNOSIS — Z0000 Encounter for general adult medical examination without abnormal findings: Secondary | ICD-10-CM

## 2017-05-10 DIAGNOSIS — K219 Gastro-esophageal reflux disease without esophagitis: Secondary | ICD-10-CM

## 2017-05-10 DIAGNOSIS — Z125 Encounter for screening for malignant neoplasm of prostate: Secondary | ICD-10-CM

## 2017-05-10 DIAGNOSIS — R079 Chest pain, unspecified: Secondary | ICD-10-CM | POA: Diagnosis not present

## 2017-05-10 LAB — LIPID PANEL
CHOL/HDL RATIO: 7
Cholesterol: 188 mg/dL (ref 0–200)
HDL: 28.8 mg/dL — ABNORMAL LOW (ref >39.00)
LDL CALC: 132 mg/dL — AB (ref 0–99)
NonHDL: 159.22
TRIGLYCERIDES: 134 mg/dL (ref 0.0–149.0)
VLDL: 26.8 mg/dL (ref 0.0–40.0)

## 2017-05-10 LAB — BASIC METABOLIC PANEL
BUN: 13 mg/dL (ref 6–23)
CALCIUM: 9.2 mg/dL (ref 8.4–10.5)
CO2: 27 mEq/L (ref 19–32)
CREATININE: 0.92 mg/dL (ref 0.40–1.50)
Chloride: 104 mEq/L (ref 96–112)
GFR: 94.04 mL/min (ref >60.00)
Glucose, Bld: 87 mg/dL (ref 70–99)
Potassium: 4 mEq/L (ref 3.5–5.1)
Sodium: 140 mEq/L (ref 135–145)

## 2017-05-10 LAB — CBC WITH DIFFERENTIAL/PLATELET
BASOS ABS: 0.1 10*3/uL (ref 0.0–0.1)
Basophils Relative: 0.9 % (ref 0.0–3.0)
EOS ABS: 0.3 10*3/uL (ref 0.0–0.7)
Eosinophils Relative: 2.6 % (ref 0.0–5.0)
HCT: 44.9 % (ref 39.0–52.0)
HEMOGLOBIN: 15 g/dL (ref 13.0–17.0)
Lymphocytes Relative: 23.5 % (ref 12.0–46.0)
Lymphs Abs: 2.7 10*3/uL (ref 0.7–4.0)
MCHC: 33.4 g/dL (ref 30.0–36.0)
MCV: 85.1 fl (ref 78.0–100.0)
Monocytes Absolute: 1 10*3/uL (ref 0.1–1.0)
Monocytes Relative: 8.5 % (ref 3.0–12.0)
NEUTROS ABS: 7.5 10*3/uL (ref 1.4–7.7)
Neutrophils Relative %: 64.5 % (ref 43.0–77.0)
PLATELETS: 312 10*3/uL (ref 150.0–400.0)
RBC: 5.28 Mil/uL (ref 4.22–5.81)
RDW: 14 % (ref 11.5–15.5)
WBC: 11.6 10*3/uL — AB (ref 4.0–10.5)

## 2017-05-10 LAB — PSA: PSA: 0.43 ng/mL (ref 0.10–4.00)

## 2017-05-10 LAB — HEMOGLOBIN A1C: Hgb A1c MFr Bld: 5.4 % (ref 4.6–6.5)

## 2017-05-10 LAB — HEPATIC FUNCTION PANEL
ALT: 15 U/L (ref 0–53)
AST: 13 U/L (ref 0–37)
Albumin: 4.4 g/dL (ref 3.5–5.2)
Alkaline Phosphatase: 61 U/L (ref 39–117)
BILIRUBIN DIRECT: 0.1 mg/dL (ref 0.0–0.3)
BILIRUBIN TOTAL: 0.9 mg/dL (ref 0.2–1.2)
Total Protein: 6.2 g/dL (ref 6.0–8.3)

## 2017-05-10 LAB — TSH: TSH: 1.39 u[IU]/mL (ref 0.35–4.50)

## 2017-05-10 NOTE — Patient Instructions (Signed)
It was great seeing you today.   I will follow up with you regarding your blood work.   Someone from cardiology will call yo to schedule your appointment and stress test   Health Maintenance, Male A healthy lifestyle and preventative care can promote health and wellness.  Maintain regular health, dental, and eye exams.  Eat a healthy diet. Foods like vegetables, fruits, whole grains, low-fat dairy products, and lean protein foods contain the nutrients you need and are low in calories. Decrease your intake of foods high in solid fats, added sugars, and salt. Get information about a proper diet from your health care provider, if necessary.  Regular physical exercise is one of the most important things you can do for your health. Most adults should get at least 150 minutes of moderate-intensity exercise (any activity that increases your heart rate and causes you to sweat) each week. In addition, most adults need muscle-strengthening exercises on 2 or more days a week.   Maintain a healthy weight. The body mass index (BMI) is a screening tool to identify possible weight problems. It provides an estimate of body fat based on height and weight. Your health care provider can find your BMI and can help you achieve or maintain a healthy weight. For males 20 years and older:  A BMI below 18.5 is considered underweight.  A BMI of 18.5 to 24.9 is normal.  A BMI of 25 to 29.9 is considered overweight.  A BMI of 30 and above is considered obese.  Maintain normal blood lipids and cholesterol by exercising and minimizing your intake of saturated fat. Eat a balanced diet with plenty of fruits and vegetables. Blood tests for lipids and cholesterol should begin at age 46 and be repeated every 5 years. If your lipid or cholesterol levels are high, you are over age 150, or you are at high risk for heart disease, you may need your cholesterol levels checked more frequently.Ongoing high lipid and cholesterol  levels should be treated with medicines if diet and exercise are not working.  If you smoke, find out from your health care provider how to quit. If you do not use tobacco, do not start.  Lung cancer screening is recommended for adults aged 55-80 years who are at high risk for developing lung cancer because of a history of smoking. A yearly low-dose CT scan of the lungs is recommended for people who have at least a 30-pack-year history of smoking and are current smokers or have quit within the past 15 years. A pack year of smoking is smoking an average of 1 pack of cigarettes a day for 1 year (for example, a 30-pack-year history of smoking could mean smoking 1 pack a day for 30 years or 2 packs a day for 15 years). Yearly screening should continue until the smoker has stopped smoking for at least 15 years. Yearly screening should be stopped for people who develop a health problem that would prevent them from having lung cancer treatment.  If you choose to drink alcohol, do not have more than 2 drinks per day. One drink is considered to be 12 oz (360 mL) of beer, 5 oz (150 mL) of wine, or 1.5 oz (45 mL) of liquor.  Avoid the use of street drugs. Do not share needles with anyone. Ask for help if you need support or instructions about stopping the use of drugs.  High blood pressure causes heart disease and increases the risk of stroke. High blood pressure is  more likely to develop in:  People who have blood pressure in the end of the normal range (100-139/85-89 mm Hg).  People who are overweight or obese.  People who are African American.  If you are 52-41 years of age, have your blood pressure checked every 3-5 years. If you are 1 years of age or older, have your blood pressure checked every year. You should have your blood pressure measured twice--once when you are at a hospital or clinic, and once when you are not at a hospital or clinic. Record the average of the two measurements. To check your  blood pressure when you are not at a hospital or clinic, you can use:  An automated blood pressure machine at a pharmacy.  A home blood pressure monitor.  If you are 64-58 years old, ask your health care provider if you should take aspirin to prevent heart disease.  Diabetes screening involves taking a blood sample to check your fasting blood sugar level. This should be done once every 3 years after age 58 if you are at a normal weight and without risk factors for diabetes. Testing should be considered at a younger age or be carried out more frequently if you are overweight and have at least 1 risk factor for diabetes.  Colorectal cancer can be detected and often prevented. Most routine colorectal cancer screening begins at the age of 20 and continues through age 38. However, your health care provider may recommend screening at an earlier age if you have risk factors for colon cancer. On a yearly basis, your health care provider may provide home test kits to check for hidden blood in the stool. A small camera at the end of a tube may be used to directly examine the colon (sigmoidoscopy or colonoscopy) to detect the earliest forms of colorectal cancer. Talk to your health care provider about this at age 72 when routine screening begins. A direct exam of the colon should be repeated every 5-10 years through age 66, unless early forms of precancerous polyps or small growths are found.  People who are at an increased risk for hepatitis B should be screened for this virus. You are considered at high risk for hepatitis B if:  You were born in a country where hepatitis B occurs often. Talk with your health care provider about which countries are considered high risk.  Your parents were born in a high-risk country and you have not received a shot to protect against hepatitis B (hepatitis B vaccine).  You have HIV or AIDS.  You use needles to inject street drugs.  You live with, or have sex with,  someone who has hepatitis B.  You are a man who has sex with other men (MSM).  You get hemodialysis treatment.  You take certain medicines for conditions like cancer, organ transplantation, and autoimmune conditions.  Hepatitis C blood testing is recommended for all people born from [birth year] through 1965 and any individual with known risk factors for hepatitis C.  Healthy men should no longer receive prostate-specific antigen (PSA) blood tests as part of routine cancer screening. Talk to your health care provider about prostate cancer screening.  Testicular cancer screening is not recommended for adolescents or adult males who have no symptoms. Screening includes self-exam, a health care provider exam, and other screening tests. Consult with your health care provider about any symptoms you have or any concerns you have about testicular cancer.  Practice safe sex. Use condoms and avoid high-risk sexual  practices to reduce the spread of sexually transmitted infections (STIs).  You should be screened for STIs, including gonorrhea and chlamydia if:  You are sexually active and are younger than 24 years.  You are older than 24 years, and your health care provider tells you that you are at risk for this type of infection.  Your sexual activity has changed since you were last screened, and you are at an increased risk for chlamydia or gonorrhea. Ask your health care provider if you are at risk.  If you are at risk of being infected with HIV, it is recommended that you take a prescription medicine daily to prevent HIV infection. This is called pre-exposure prophylaxis (PrEP). You are considered at risk if:  You are a man who has sex with other men (MSM).  You are a heterosexual man who is sexually active with multiple partners.  You take drugs by injection.  You are sexually active with a partner who has HIV.  Talk with your health care provider about whether you are at high risk of being  infected with HIV. If you choose to begin PrEP, you should first be tested for HIV. You should then be tested every 3 months for as long as you are taking PrEP.  Use sunscreen. Apply sunscreen liberally and repeatedly throughout the day. You should seek shade when your shadow is shorter than you. Protect yourself by wearing long sleeves, pants, a wide-brimmed hat, and sunglasses year round whenever you are outdoors.  Tell your health care provider of new moles or changes in moles, especially if there is a change in shape or color. Also, tell your health care provider if a mole is larger than the size of a pencil eraser.  A one-time screening for abdominal aortic aneurysm (AAA) and surgical repair of large AAAs by ultrasound is recommended for men aged 68-75 years who are current or former smokers.  Stay current with your vaccines (immunizations).   This information is not intended to replace advice given to you by your health care provider. Make sure you discuss any questions you have with your health care provider.   Document Released: 03/11/2008 Document Revised: 10/04/2014 Document Reviewed: 02/08/2011 Elsevier Interactive Patient Education [employer].

## 2017-05-10 NOTE — Progress Notes (Signed)
Subjective:    Patient ID: Paul Huff, male    DOB: 04/13/1971, 46 y.o.   MRN: [id number]  HPI  Patient presents for yearly preventative medicine examination. He is a pleasant 46 year old male who  has a past medical history of Acid reflux and Sciatica.  All immunizations and health maintenance protocols were reviewed with the patient and needed orders were placed. He is UTD on all vaccinations   Appropriate screening laboratory values were ordered for the patient including screening of hyperlipidemia, renal function and hepatic function. If indicated by BPH, a PSA was ordered.  Medication reconciliation,  past medical history, social history, problem list and allergies were reviewed in detail with the patient  Goals were established with regard to weight loss, exercise, and  diet in compliance with medications. He enjoys walking on the treadmill and playing golf.   Wt Readings from Last 3 Encounters:  05/10/17 259 lb (117.5 kg)  05/03/17 268 lb (121.6 kg)  04/09/17 250 lb (113.4 kg)    He participates in routine dental and vision care. He is not due for a colonoscopy  He has an upcoming appointment with Dr. Ophelia Charter in September for chronic sciatica/low back pain  He only takes Nexium for GERD  His only complaint of chest pain. He reports that he has had intermittent chest pain over the last year. He does report worsening symptoms when he exercises. Pain is located mid sternal and he feels as though " an elephant is sitting on my chest". He denies any SOB.  The last time this happened was three days ago when he was headed to work. He has no history of DVT. His EKG on 7/14/ and 7/15 2018 showed Normal Sinus with ST elevation - prob. Early repolarization    Review of Systems  Constitutional: Negative.   HENT: Negative.   Eyes: Negative.   Respiratory: Negative.   Cardiovascular: Negative.   Gastrointestinal: Negative.   Genitourinary: Negative.   Musculoskeletal:  Positive for back pain.  Skin: Negative.   Neurological: Negative.   Hematological: Negative.   Psychiatric/Behavioral: Negative.   All other systems reviewed and are negative.  Past Medical History:  Diagnosis Date  . Acid reflux   . Sciatica     Social History   Social History  . Marital status: Married    Spouse name: N/A  . Number of children: N/A  . Years of education: N/A   Occupational History  . Not on file.   Social History Main Topics  . Smoking status: Former Games developer  . Smokeless tobacco: Never Used  . Alcohol use 4.8 oz/week    8 Shots of liquor per week     Comment: social drinking on weekend  . Drug use: Yes    Types: Marijuana  . Sexual activity: Not Currently   Other Topics Concern  . Not on file   Social History Narrative   Administrator, Civil Service    Married    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     Past Surgical History:  Procedure Laterality Date  . TONSILLECTOMY  1985    Family History  Problem Relation Age of Onset  . Adopted: Yes    No Known Allergies  Current Outpatient Prescriptions on File Prior to Visit  Medication Sig Dispense Refill  . esomeprazole (NEXIUM) 20 MG capsule Take 20 mg by mouth daily at 12 noon.  No current facility-administered medications on file prior to visit.     BP 130/86 (BP Location: Left Arm)   Temp 98 F (36.7 C) (Oral)   Ht 5\' 9"  (1.753 m)   Wt 259 lb (117.5 kg)   BMI 38.25 kg/m       Objective:   Physical Exam  Constitutional: He is oriented to person, place, and time. He appears well-developed and well-nourished. No distress.  obese  HENT:  Head: Normocephalic and atraumatic.  Right Ear: External ear normal.  Left Ear: External ear normal.  Nose: Nose normal.  Mouth/Throat: Oropharynx is clear and moist. No oropharyngeal exudate.  Eyes: Pupils are equal, round, and reactive to light. Conjunctivae and EOM are normal. Right eye exhibits no discharge.  Left eye exhibits no discharge. No scleral icterus.  Neck: Normal range of motion. Neck supple. No JVD present. No tracheal deviation present. No thyromegaly present.  Cardiovascular: Normal rate, regular rhythm, normal heart sounds and intact distal pulses.  Exam reveals no gallop and no friction rub.   No murmur heard. Pulmonary/Chest: Effort normal and breath sounds normal. No stridor. No respiratory distress. He has no wheezes. He has no rales. He exhibits no tenderness.  Abdominal: Soft. Bowel sounds are normal. He exhibits no distension and no mass. There is no tenderness. There is no rebound and no guarding.  Genitourinary:  Genitourinary Comments: deferred  Musculoskeletal: Normal range of motion. He exhibits no edema, tenderness or deformity.  Lymphadenopathy:    He has no cervical adenopathy.  Neurological: He is alert and oriented to person, place, and time. He has normal reflexes. He displays normal reflexes. No cranial nerve deficit. He exhibits normal muscle tone. Coordination normal.  Skin: Skin is warm and dry. No rash noted. He is not diaphoretic. No erythema. No pallor.  Psychiatric: He has a normal mood and affect. His behavior is normal. Judgment and thought content normal.  Nursing note and vitals reviewed.     Assessment & Plan:  1. Routine general medical examination at a health care facility - Educated on the importance of diet and exercise.  - Follow up in one year or sooner if needed - Basic metabolic panel - CBC with Differential/Platelet - Hemoglobin A1c - Hepatic function panel - Lipid panel - PSA - TSH  2. Chest pain, unspecified type - Ambulatory referral to Cardiology - ECHOCARDIOGRAM STRESS TEST; Future  3. Gastroesophageal reflux disease, esophagitis presence not specified - Continue with Nexium    Shirline Freesory Eilan Mcinerny, NP

## 2017-05-11 ENCOUNTER — Encounter: Payer: Self-pay | Admitting: Physician Assistant

## 2017-06-02 ENCOUNTER — Telehealth (HOSPITAL_COMMUNITY): Payer: Self-pay | Admitting: *Deleted

## 2017-06-02 ENCOUNTER — Ambulatory Visit: Payer: Managed Care, Other (non HMO) | Admitting: Physician Assistant

## 2017-06-02 NOTE — Telephone Encounter (Signed)
Patient given detailed instructions per Stress Test Requisition Sheet for test on 06/07/17 at 7:30.Patient Notified to arrive 30 minutes early, and that it is imperative to arrive on time for appointment to keep from having the test rescheduled.  Patient verbalized understanding. Daneil DolinSharon S Brooks

## 2017-06-08 ENCOUNTER — Other Ambulatory Visit (HOSPITAL_COMMUNITY): Payer: Managed Care, Other (non HMO)

## 2017-06-14 ENCOUNTER — Ambulatory Visit (INDEPENDENT_AMBULATORY_CARE_PROVIDER_SITE_OTHER): Payer: Self-pay | Admitting: Orthopaedic Surgery

## 2017-06-17 ENCOUNTER — Encounter: Payer: Self-pay | Admitting: Adult Health

## 2017-07-26 ENCOUNTER — Ambulatory Visit (INDEPENDENT_AMBULATORY_CARE_PROVIDER_SITE_OTHER): Payer: Managed Care, Other (non HMO) | Admitting: Orthopaedic Surgery

## 2017-08-08 ENCOUNTER — Ambulatory Visit: Payer: Self-pay | Admitting: Physician Assistant

## 2017-08-08 NOTE — Progress Notes (Deleted)
Cardiology Office Note    Date:  08/08/2017   ID:  Paul Huff, DOB 1971/05/01, MRN [id number]  PCP:  Shirline FreesNafziger, Cory, NP  Cardiologist:   No chief complaint on file.   History of Present Illness:  Paul Huff is a 46 y.o. male ***    Past Medical History:  Diagnosis Date  . Acid reflux   . Sciatica     Past Surgical History:  Procedure Laterality Date  . TONSILLECTOMY  1985    Current Medications: No outpatient medications have been marked as taking for the 08/08/17 encounter (Appointment) with Dyann KiefLenze, Hera Celaya M, PA-C.     Allergies:   Patient has no known allergies.   Social History   Socioeconomic History  . Marital status: Married    Spouse name: Not on file  . Number of children: Not on file  . Years of education: Not on file  . Highest education level: Not on file  Social Needs  . Financial resource strain: Not on file  . Food insecurity - worry: Not on file  . Food insecurity - inability: Not on file  . Transportation needs - medical: Not on file  . Transportation needs - non-medical: Not on file  Occupational History  . Not on file  Tobacco Use  . Smoking status: Former Games developermoker  . Smokeless tobacco: Never Used  Substance and Sexual Activity  . Alcohol use: Yes    Alcohol/week: 4.8 oz    Types: 8 Shots of liquor per week    Comment: social drinking on weekend  . Drug use: Yes    Types: Marijuana  . Sexual activity: Not Currently  Other Topics Concern  . Not on file  Social History Narrative   Administrator, Civil Serviceales Consultant    Married    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      Family History:  The patient's ***family history is not on file. He was adopted.   ROS:   Please see the history of present illness.    ROS All other systems reviewed and are negative.   PHYSICAL EXAM:   VS:  There were no vitals taken for this visit.  Physical Exam  GEN: Well nourished, well developed, in no  acute distress  HEENT: normal  Neck: no JVD, carotid bruits, or masses Cardiac:RRR; no murmurs, rubs, or gallops  Respiratory:  clear to auscultation bilaterally, normal work of breathing GI: soft, nontender, nondistended, + BS Ext: without cyanosis, clubbing, or edema, Good distal pulses bilaterally MS: no deformity or atrophy  Skin: warm and dry, no rash Neuro:  Alert and Oriented x 3, Strength and sensation are intact Psych: euthymic mood, full affect  Wt Readings from Last 3 Encounters:  05/10/17 259 lb (117.5 kg)  05/03/17 268 lb (121.6 kg)  04/09/17 250 lb (113.4 kg)      Studies/Labs Reviewed:   EKG:  EKG is*** ordered today.  The ekg ordered today demonstrates ***  Recent Labs: 05/10/2017: ALT 15; BUN 13; Creatinine, Ser 0.92; Hemoglobin 15.0; Platelets 312.0; Potassium 4.0; Sodium 140; TSH 1.39   Lipid Panel    Component Value Date/Time   CHOL 188 05/10/2017 0732   TRIG 134.0 05/10/2017 0732   HDL 28.80 (L) 05/10/2017 0732   CHOLHDL 7 05/10/2017 0732   VLDL 26.8 05/10/2017 0732   LDLCALC 132 (H) 05/10/2017 0732    Additional studies/ records that were reviewed today include:  ***  ASSESSMENT:    No diagnosis found.   PLAN:  In order of problems listed above:      Medication Adjustments/Labs and Tests Ordered: Current medicines are reviewed at length with the patient today.  Concerns regarding medicines are outlined above.  Medication changes, Labs and Tests ordered today are listed in the Patient Instructions below. There are no Patient Instructions on file for this visit.   Elson ClanSigned, Kevina Piloto, PA-C  08/08/2017 9:01 AM    [facility] [address], [place], [place]  [address] Phone: [phone]) [phone]; Fax: [phone]

## 2017-08-09 ENCOUNTER — Encounter: Payer: Self-pay | Admitting: Physician Assistant

## 2017-09-08 ENCOUNTER — Other Ambulatory Visit (HOSPITAL_COMMUNITY): Payer: Self-pay

## 2017-10-13 ENCOUNTER — Ambulatory Visit: Payer: Self-pay | Admitting: *Deleted

## 2017-10-13 NOTE — Telephone Encounter (Signed)
Patient is suffering from sciatic pain- he is so bad at this point that he can't sit without rocking. He states he has gotten to the point that he briefly considered hurting himself out of frustration. Call to flow coordinator- patient had been referred before- but lost insurance. He does have insurance now. Told patient his provider would review his note and decided the best course of action- maybe UC today and do referral or appointment at office- he will be contacted. Patient contact [phone]  Reason for Disposition . [1] SEVERE back pain (e.g., excruciating, unable to do any normal activities) AND [2] not improved 2 hours after pain medicine  Answer Assessment - Initial Assessment Questions 1. ONSET: "When did the pain begin?"      On going- but last 2 months it has gotten worse- patient is in so much pain he can't sleep at night 2. LOCATION: "Where does it hurt?" (upper, mid or lower back)     Lower back , hip and right leg down to toes 3. SEVERITY: "How bad is the pain?"  (e.g., Scale 1-10; mild, moderate, or severe)   - MILD (1-3): doesn't interfere with normal activities    - MODERATE (4-7): interferes with normal activities or awakens from sleep    - SEVERE (8-10): excruciating pain, unable to do any normal activities      10- can't be still because he hurts so bad 4. PATTERN: "Is the pain constant?" (e.g., yes, no; constant, intermittent)      Constant pain 5. RADIATION: "Does the pain shoot into your legs or elsewhere?"     Radiates down to right leg 6. CAUSE:  "What do you think is causing the back pain?"      Nerve pain 7. BACK OVERUSE:  "Any recent lifting of heavy objects, strenuous work or exercise?"     May have injured it playing sports 8. MEDICATIONS: "What have you taken so far for the pain?" (e.g., nothing, acetaminophen, NSAIDS)     Ibuprofen- 1600mg  9. NEUROLOGIC SYMPTOMS: "Do you have any weakness, numbness, or problems with bowel/bladder control?"     No- just  pain to sit 10. OTHER SYMPTOMS: "Do you have any other symptoms?" (e.g., fever, abdominal pain, burning with urination, blood in urine)       no 11. PREGNANCY: "Is there any chance you are pregnant?" (e.g., yes, no; LMP)       n/a  Protocols used: BACK PAIN-A-AH

## 2017-10-13 NOTE — Telephone Encounter (Signed)
Per Kandee Keenory, patient will need to be seen to further evaluation. I spoke with pt and we had opening for today with another provider however pt unable to come in today- he has job interview and cannot miss it. I offered something for Friday 10/14/2017 and he accepted that appointment.

## 2017-10-14 ENCOUNTER — Ambulatory Visit: Payer: BLUE CROSS/BLUE SHIELD | Admitting: Family Medicine

## 2017-10-14 ENCOUNTER — Encounter: Payer: Self-pay | Admitting: Family Medicine

## 2017-10-14 VITALS — BP 140/86 | HR 89 | Temp 99.0°F | Wt 270.1 lb

## 2017-10-14 DIAGNOSIS — M5431 Sciatica, right side: Secondary | ICD-10-CM

## 2017-10-14 MED ORDER — CYCLOBENZAPRINE HCL 5 MG PO TABS: 5.0000 mg | ORAL_TABLET | Freq: Three times a day (TID) | ORAL | 1 refills | Status: DC | PRN

## 2017-10-14 MED ORDER — MELOXICAM 7.5 MG PO TABS: 7.5000 mg | ORAL_TABLET | Freq: Every day | ORAL | 0 refills | Status: DC

## 2017-10-14 NOTE — Patient Instructions (Addendum)
Sciatica Sciatica is pain, numbness, weakness, or tingling along the path of the sciatic nerve. The sciatic nerve starts in the lower back and runs down the back of each leg. The nerve controls the muscles in the lower leg and in the back of the knee. It also provides feeling (sensation) to the back of the thigh, the lower leg, and the sole of the foot. Sciatica is a symptom of another medical condition that pinches or puts pressure on the sciatic nerve. Generally, sciatica only affects one side of the body. Sciatica usually goes away on its own or with treatment. In some cases, sciatica may keep coming back (recur). What are the causes? This condition is caused by pressure on the sciatic nerve, or pinching of the sciatic nerve. This may be the result of:  A disk in between the bones of the spine (vertebrae) bulging out too far (herniated disk).  Age-related changes in the spinal disks (degenerative disk disease).  A pain disorder that affects a muscle in the buttock (piriformis syndrome).  Extra bone growth (bone spur) near the sciatic nerve.  An injury or break (fracture) of the pelvis.  Pregnancy.  Tumor (rare).  What increases the risk? The following factors may make you more likely to develop this condition:  Playing sports that place pressure or stress on the spine, such as football or weight lifting.  Having poor strength and flexibility.  A history of back injury.  A history of back surgery.  Sitting for long periods of time.  Doing activities that involve repetitive bending or lifting.  Obesity.  What are the signs or symptoms? Symptoms can vary from mild to very severe, and they may include:  Any of these problems in the lower back, leg, hip, or buttock: ? Mild tingling or dull aches. ? Burning sensations. ? Sharp pains.  Numbness in the back of the calf or the sole of the foot.  Leg weakness.  Severe back pain that makes movement difficult.  These  symptoms may get worse when you cough, sneeze, or laugh, or when you sit or stand for long periods of time. Being overweight may also make symptoms worse. In some cases, symptoms may recur over time. How is this diagnosed? This condition may be diagnosed based on:  Your symptoms.  A physical exam. Your health care provider may ask you to do certain movements to check whether those movements trigger your symptoms.  You may have tests, including: ? Blood tests. ? X-rays. ? MRI. ? CT scan.  How is this treated? In many cases, this condition improves on its own, without any treatment. However, treatment may include:  Reducing or modifying physical activity during periods of pain.  Exercising and stretching to strengthen your abdomen and improve the flexibility of your spine.  Icing and applying heat to the affected area.  Medicines that help: ? To relieve pain and swelling. ? To relax your muscles.  Injections of medicines that help to relieve pain, irritation, and inflammation around the sciatic nerve (steroids).  Surgery.  Follow these instructions at home: Medicines  Take over-the-counter and prescription medicines only as told by your health care provider.  Do not drive or operate heavy machinery while taking prescription pain medicine. Managing pain  If directed, apply ice to the affected area. ? Put ice in a plastic bag. ? Place a towel between your skin and the bag. ? Leave the ice on for 20 minutes, 2-3 times a day.  After icing, apply   heat to the affected area before you exercise or as often as told by your health care provider. Use the heat source that your health care provider recommends, such as a moist heat pack or a heating pad. ? Place a towel between your skin and the heat source. ? Leave the heat on for 20-30 minutes. ? Remove the heat if your skin turns bright red. This is especially important if you are unable to feel pain, heat, or cold. You may have a  greater risk of getting burned. Activity  Return to your normal activities as told by your health care provider. Ask your health care provider what activities are safe for you. ? Avoid activities that make your symptoms worse.  Take brief periods of rest throughout the day. Resting in a lying or standing position is usually better than sitting to rest. ? When you rest for longer periods, mix in some mild activity or stretching between periods of rest. This will help to prevent stiffness and pain. ? Avoid sitting for long periods of time without moving. Get up and move around at least one time each hour.  Exercise and stretch regularly, as told by your health care provider.  Do not lift anything that is heavier than 10 lb (4.5 kg) while you have symptoms of sciatica. When you do not have symptoms, you should still avoid heavy lifting, especially repetitive heavy lifting.  When you lift objects, always use proper lifting technique, which includes: ? Bending your knees. ? Keeping the load close to your body. ? Avoiding twisting. General instructions  Use good posture. ? Avoid leaning forward while sitting. ? Avoid hunching over while standing.  Maintain a healthy weight. Excess weight puts extra stress on your back and makes it difficult to maintain good posture.  Wear supportive, comfortable shoes. Avoid wearing high heels.  Avoid sleeping on a mattress that is too soft or too hard. A mattress that is firm enough to support your back when you sleep may help to reduce your pain.  Keep all follow-up visits as told by your health care provider. This is important. Contact a health care provider if:  You have pain that wakes you up when you are sleeping.  You have pain that gets worse when you lie down.  Your pain is worse than you have experienced in the past.  Your pain lasts longer than 4 weeks.  You experience unexplained weight loss. Get help right away if:  You lose control  of your bowel or bladder (incontinence).  You have: ? Weakness in your lower back, pelvis, buttocks, or legs that gets worse. ? Redness or swelling of your back. ? A burning sensation when you urinate. This information is not intended to replace advice given to you by your health care provider. Make sure you discuss any questions you have with your health care provider. Document Released: 09/07/2001 Document Revised: 02/17/2016 Document Reviewed: 05/23/2015 [employer] Interactive Patient Education  2018 [employer]  Sciatica Rehab Ask your health care provider which exercises are safe for you. Do exercises exactly as told by your health care provider and adjust them as directed. It is normal to feel mild stretching, pulling, tightness, or discomfort as you do these exercises, but you should stop right away if you feel sudden pain or your pain gets worse.Do not begin these exercises until told by your health care provider. Stretching and range of motion exercises These exercises warm up your muscles and joints and improve the movement   and flexibility of your hips and your back. These exercises also help to relieve pain, numbness, and tingling. Exercise A: Sciatic nerve glide 1. Sit in a chair with your head facing down toward your chest. Place your hands behind your back. Let your shoulders slump forward. 2. Slowly straighten one of your knees while you tilt your head back as if you are looking toward the ceiling. Only straighten your leg as far as you can without making your symptoms worse. 3. Hold for __________ seconds. 4. Slowly return to the starting position. 5. Repeat with your other leg. Repeat __________ times. Complete this exercise __________ times a day. Exercise B: Knee to chest with hip adduction and internal rotation  1. Lie on your back on a firm surface with both legs straight. 2. Bend one of your knees and move it up toward your chest until you feel a gentle stretch in your  lower back and buttock. Then, move your knee toward the shoulder that is on the opposite side from your leg. ? Hold your leg in this position by holding onto the front of your knee. 3. Hold for __________ seconds. 4. Slowly return to the starting position. 5. Repeat with your other leg. Repeat __________ times. Complete this exercise __________ times a day. Exercise C: Prone extension on elbows  1. Lie on your abdomen on a firm surface. A bed may be too soft for this exercise. 2. Prop yourself up on your elbows. 3. Use your arms to help lift your chest up until you feel a gentle stretch in your abdomen and your lower back. ? This will place some of your body weight on your elbows. If this is uncomfortable, try stacking pillows under your chest. ? Your hips should stay down, against the surface that you are lying on. Keep your hip and back muscles relaxed. 4. Hold for __________ seconds. 5. Slowly relax your upper body and return to the starting position. Repeat __________ times. Complete this exercise __________ times a day. Strengthening exercises These exercises build strength and endurance in your back. Endurance is the ability to use your muscles for a long time, even after they get tired. Exercise D: Pelvic tilt 1. Lie on your back on a firm surface. Bend your knees and keep your feet flat. 2. Tense your abdominal muscles. Tip your pelvis up toward the ceiling and flatten your lower back into the floor. ? To help with this exercise, you may place a small towel under your lower back and try to push your back into the towel. 3. Hold for __________ seconds. 4. Let your muscles relax completely before you repeat this exercise. Repeat __________ times. Complete this exercise __________ times a day. Exercise E: Alternating arm and leg raises  1. Get on your hands and knees on a firm surface. If you are on a hard floor, you may want to use padding to cushion your knees, such as an exercise  mat. 2. Line up your arms and legs. Your hands should be below your shoulders, and your knees should be below your hips. 3. Lift your left leg behind you. At the same time, raise your right arm and straighten it in front of you. ? Do not lift your leg higher than your hip. ? Do not lift your arm higher than your shoulder. ? Keep your abdominal and back muscles tight. ? Keep your hips facing the ground. ? Do not arch your back. ? Keep your balance carefully, and do not   hold your breath. 4. Hold for __________ seconds. 5. Slowly return to the starting position and repeat with your right leg and your left arm. Repeat __________ times. Complete this exercise __________ times a day. Posture and body mechanics  Body mechanics refers to the movements and positions of your body while you do your daily activities. Posture is part of body mechanics. Good posture and healthy body mechanics can help to relieve stress in your body's tissues and joints. Good posture means that your spine is in its natural S-curve position (your spine is neutral), your shoulders are pulled back slightly, and your head is not tipped forward. The following are general guidelines for applying improved posture and body mechanics to your everyday activities. Standing   When standing, keep your spine neutral and your feet about hip-width apart. Keep a slight bend in your knees. Your ears, shoulders, and hips should line up.  When you do a task in which you stand in one place for a long time, place one foot up on a stable object that is 2-4 inches (5-10 cm) high, such as a footstool. This helps keep your spine neutral. Sitting   When sitting, keep your spine neutral and keep your feet flat on the floor. Use a footrest, if necessary, and keep your thighs parallel to the floor. Avoid rounding your shoulders, and avoid tilting your head forward.  When working at a desk or a computer, keep your desk at a height where your hands are  slightly lower than your elbows. Slide your chair under your desk so you are close enough to maintain good posture.  When working at a computer, place your monitor at a height where you are looking straight ahead and you do not have to tilt your head forward or downward to look at the screen. Resting   When lying down and resting, avoid positions that are most painful for you.  If you have pain with activities such as sitting, bending, stooping, or squatting (flexion-based activities), lie in a position in which your body does not bend very much. For example, avoid curling up on your side with your arms and knees near your chest (fetal position).  If you have pain with activities such as standing for a long time or reaching with your arms (extension-based activities), lie with your spine in a neutral position and bend your knees slightly. Try the following positions: ? Lying on your side with a pillow between your knees. ? Lying on your back with a pillow under your knees. Lifting   When lifting objects, keep your feet at least shoulder-width apart and tighten your abdominal muscles.  Bend your knees and hips and keep your spine neutral. It is important to lift using the strength of your legs, not your back. Do not lock your knees straight out.  Always ask for help to lift heavy or awkward objects. This information is not intended to replace advice given to you by your health care provider. Make sure you discuss any questions you have with your health care provider. Document Released: 09/13/2005 Document Revised: 05/20/2016 Document Reviewed: 05/30/2015 Elsevier Interactive Patient Education  2018 [employer]  

## 2017-10-14 NOTE — Progress Notes (Signed)
Subjective:    Patient ID: Paul Huff, male    DOB: Jul 31, 1971, 47 y.o.   MRN: [id number]  Chief Complaint  Patient presents with  . Sciatica    HPI Patient was seen today for acute concern.  Pt has an ongoing h/o sciatica and back pain.  Pt states in the past a referral was placed to Ortho but he was unable to go as his insurance lapsed.  Pt states this most recent episode is a 12/10 pain.  Pt states he is so uncomfortable that he cannot lay down flat nor sleep.  Pt endorses pain/numbness/tingling from low back down right leg to in between his R great toe and 2nd toe.  Pt has taken Ibuprofen 1600 mg, Tylenol, Naprosyn, and Tramadol- that he got from his mother s/p her knee replacement surgery.  Past Medical History:  Diagnosis Date  . Acid reflux   . Sciatica     No Known Allergies  ROS General: Denies fever, chills, night sweats, changes in weight, changes in appetite HEENT: Denies headaches, ear pain, changes in vision, rhinorrhea, sore throat CV: Denies CP, palpitations, SOB, orthopnea Pulm: Denies SOB, cough, wheezing GI: Denies abdominal pain, nausea, vomiting, diarrhea, constipation GU: Denies dysuria, hematuria, frequency, vaginal discharge Msk: Denies muscle cramps, joint pains  + low back pain with pain radiating down right leg and into her right foot Neuro: Denies weakness, numbness, tingling Skin: Denies rashes, bruising Psych: Denies depression, anxiety, hallucinations     Objective:    Blood pressure 140/86, pulse 89, temperature 99 F (37.2 C), temperature source Oral, weight 270 lb 1.6 oz (122.5 kg).   Gen. Pleasant, well-nourished, in no distress, normal affect   HEENT: /AT, face symmetric, conjunctiva clear, no scleral icterus, PERRLA, nares patent without drainage Lungs: no accessory muscle use, CTAB, no wheezes or rales Cardiovascular: RRR, no m/r/g, no peripheral edema Musculoskeletal: No deformities, no cyanosis or clubbing, normal tone.   Tenderness to palpation of lumbar spine midline L4/L5.  TTP sciatic nerve and right gluteal.  Patient appears uncomfortable attempting to lie flat on his back.  Unable to perform straight leg raise secondary to pain and patient cooperation.  Adduction of right lower extremity limited secondary to pain.  Further examination of right lower extremity limited secondary to patient's pain. Neuro:  A&Ox3, CN II-XII intact, ambulating with a slight limp.  Wt Readings from Last 3 Encounters:  10/14/17 270 lb 1.6 oz (122.5 kg)  05/10/17 259 lb (117.5 kg)  05/03/17 268 lb (121.6 kg)    Lab Results  Component Value Date   WBC 11.6 (H) 05/10/2017   HGB 15.0 05/10/2017   HCT 44.9 05/10/2017   PLT 312.0 05/10/2017   GLUCOSE 87 05/10/2017   CHOL 188 05/10/2017   TRIG 134.0 05/10/2017   HDL 28.80 (L) 05/10/2017   LDLCALC 132 (H) 05/10/2017   ALT 15 05/10/2017   AST 13 05/10/2017   NA 140 05/10/2017   K 4.0 05/10/2017   CL 104 05/10/2017   CREATININE 0.92 05/10/2017   BUN 13 05/10/2017   CO2 27 05/10/2017   TSH 1.39 05/10/2017   PSA 0.43 05/10/2017   HGBA1C 5.4 05/10/2017    Assessment/Plan:  Sciatica of right side  -Patient given stretching exercises. -Also encouraged to massage his leg frozen bottle of water. -In the past was referred to orthopedics but was unable to go secondary to changes in insurance.  We will re-refer to Ortho. - Plan: cyclobenzaprine (FLEXERIL) 5 MG tablet, meloxicam (MOBIC)  7.5 MG tablet, Ambulatory referral to Orthopedic Surgery   Follow-up PRN  Abbe AmsterdamShannon Elzada Pytel, MD

## 2017-10-27 ENCOUNTER — Ambulatory Visit (INDEPENDENT_AMBULATORY_CARE_PROVIDER_SITE_OTHER): Payer: Managed Care, Other (non HMO) | Admitting: Physical Medicine and Rehabilitation

## 2017-12-20 ENCOUNTER — Ambulatory Visit (INDEPENDENT_AMBULATORY_CARE_PROVIDER_SITE_OTHER): Payer: Self-pay | Admitting: Physical Medicine and Rehabilitation

## 2018-03-31 ENCOUNTER — Encounter (HOSPITAL_COMMUNITY): Payer: Self-pay | Admitting: Emergency Medicine

## 2018-03-31 ENCOUNTER — Emergency Department (HOSPITAL_COMMUNITY): Payer: BLUE CROSS/BLUE SHIELD

## 2018-03-31 ENCOUNTER — Emergency Department (HOSPITAL_COMMUNITY)
Admission: EM | Admit: 2018-03-31 | Discharge: 2018-03-31 | Disposition: A | Discharge status: Home / Self Care | Payer: BLUE CROSS/BLUE SHIELD | Attending: Emergency Medicine | Admitting: Emergency Medicine

## 2018-03-31 DIAGNOSIS — R200 Anesthesia of skin: Secondary | ICD-10-CM | POA: Insufficient documentation

## 2018-03-31 DIAGNOSIS — Z87891 Personal history of nicotine dependence: Secondary | ICD-10-CM | POA: Diagnosis not present

## 2018-03-31 DIAGNOSIS — M5412 Radiculopathy, cervical region: Secondary | ICD-10-CM | POA: Diagnosis not present

## 2018-03-31 DIAGNOSIS — M25512 Pain in left shoulder: Secondary | ICD-10-CM | POA: Diagnosis not present

## 2018-03-31 DIAGNOSIS — M542 Cervicalgia: Secondary | ICD-10-CM | POA: Diagnosis present

## 2018-03-31 MED ORDER — GABAPENTIN 300 MG PO CAPS
300.0000 mg | ORAL_CAPSULE | Freq: Once | ORAL | Status: AC
  Administered 2018-03-31: 300 mg via ORAL
  Filled 2018-03-31: qty 1

## 2018-03-31 MED ORDER — GADOBENATE DIMEGLUMINE 529 MG/ML IV SOLN
20.0000 mL | Freq: Once | INTRAVENOUS | Status: AC | PRN
  Administered 2018-03-31: 20 mL via INTRAVENOUS

## 2018-03-31 MED ORDER — HYDROCODONE-ACETAMINOPHEN 5-325 MG PO TABS
2.0000 | ORAL_TABLET | Freq: Once | ORAL | Status: AC
  Administered 2018-03-31: 2 via ORAL
  Filled 2018-03-31: qty 2

## 2018-03-31 MED ORDER — PREDNISONE 10 MG (21) PO TBPK: ORAL_TABLET | ORAL | 0 refills | Status: DC

## 2018-03-31 MED ORDER — GABAPENTIN 300 MG PO CAPS: 300.0000 mg | ORAL_CAPSULE | Freq: Three times a day (TID) | ORAL | 0 refills | Status: DC

## 2018-03-31 MED ORDER — DEXAMETHASONE SODIUM PHOSPHATE 10 MG/ML IJ SOLN
10.0000 mg | Freq: Once | INTRAMUSCULAR | Status: AC
  Administered 2018-03-31: 10 mg via INTRAMUSCULAR
  Filled 2018-03-31: qty 1

## 2018-03-31 MED ORDER — HYDROCODONE-ACETAMINOPHEN 5-325 MG PO TABS: 1.0000 | ORAL_TABLET | Freq: Four times a day (QID) | ORAL | 0 refills | Status: DC | PRN

## 2018-03-31 NOTE — ED Provider Notes (Signed)
[facility]-EMERGENCY DEPT Provider Note   CSN: [id number] Arrival date & time: 03/31/18  1222     History   Chief Complaint Chief Complaint  Patient presents with  . Neck Pain  . Arm Pain    HPI Glade Strausser is a 47 y.o. male.  HPI   Lance Huaracha is a 47 y.o. male, with a history of sciatica, presenting to the ED with pain to the left neck into the left shoulder for the past several days.  Pain is severe, shooting, radiating into the left arm.  Accompanied by numbness in fingers 3 through 5 as well as occasional weakness and loss of function.  Patient states a few years ago he had a similar instance of pain, but it resolved.  Today's pain is much worse.  Denies fever, swelling, falls/trauma, difficulty breathing, other neuro deficits, or any other complaints.   Past Medical History:  Diagnosis Date  . Acid reflux   . Sciatica     There are no active problems to display for this patient.   Past Surgical History:  Procedure Laterality Date  . TONSILLECTOMY  1985        Home Medications    Prior to Admission medications   Medication Sig Start Date End Date Taking? Authorizing Provider  Aspirin-Caffeine (BC FAST PAIN RELIEF) 845-65 MG PACK Take 1 packet by mouth daily as needed (fever/back pain).   Yes [provider]  cyclobenzaprine (FLEXERIL) 5 MG tablet Take 1 tablet (5 mg total) by mouth 3 (three) times daily as needed for muscle spasms. 10/14/17  Yes Deeann Saint, MD  ibuprofen (ADVIL,MOTRIN) 200 MG tablet Take 800 mg by mouth daily as needed for fever or moderate pain.   Yes [provider]  methocarbamol (ROBAXIN) 500 MG tablet Take 500 mg by mouth 4 (four) times daily.   Yes [provider]  gabapentin (NEURONTIN) 300 MG capsule Take 1 capsule (300 mg total) by mouth 3 (three) times daily for 15 days. 03/31/18 04/15/18  Jaymon Dudek, Hillard Danker, PA-C  HYDROcodone-acetaminophen (NORCO/VICODIN) 5-325 MG  tablet Take 1-2 tablets by mouth every 6 (six) hours as needed for severe pain. 03/31/18   Kazia Grisanti C, PA-C  meloxicam (MOBIC) 7.5 MG tablet Take 1 tablet (7.5 mg total) by mouth daily. Patient not taking: Reported on 03/31/2018 10/14/17   Deeann Saint, MD  predniSONE (STERAPRED UNI-PAK 21 TAB) 10 MG (21) TBPK tablet Take 6 tabs (60mg ) on day 1, 5 tabs (50mg ) on day 2, 4 tabs (40mg ) on day 3, 3 tabs (30mg ) on day 4, 2 tabs (20mg ) on day 5, and 1 tab (10mg ) on day 6. 03/31/18   Kitana Gage, Hillard Danker, PA-C    Family History Family History  Adopted: Yes    Social History Social History   Tobacco Use  . Smoking status: Former Games developer  . Smokeless tobacco: Never Used  Substance Use Topics  . Alcohol use: Yes    Alcohol/week: 4.8 oz    Types: 8 Shots of liquor per week    Comment: social drinking on weekend  . Drug use: Yes    Types: Marijuana     Allergies   Patient has no known allergies.   Review of Systems Review of Systems  Constitutional: Negative for fever.  Musculoskeletal: Positive for neck pain.  Neurological: Positive for weakness and numbness.     Physical Exam Updated Vital Signs BP (!) 139/93 (BP Location: Right Arm)   Pulse 73  Temp 98.5 F (36.9 C) (Oral)   Resp 18   SpO2 100%   Physical Exam  Constitutional: He appears well-developed and well-nourished. No distress.  HENT:  Head: Normocephalic and atraumatic.  Eyes: Conjunctivae are normal.  Neck: Neck supple.  Cardiovascular: Normal rate and regular rhythm.  Pulmonary/Chest: Effort normal.  Musculoskeletal: He exhibits tenderness. He exhibits no edema or deformity.  Tenderness to the left cervical musculature into the left trapezius.  No midline spinal tenderness.  No noted deformity, crepitus, instability, swelling, erythema, or increased warmth. Range of motion intact in the left shoulder.  Neurological: He is alert.  Patient notes decreased sensation in fingers 3 through 5 on the left hand.  Decreased  sensation in the left forearm into the left elbow.  Questionable grip strength weakness on the left. Abduction and adduction of the fingers intact against resistance, however, increased pain especially with abduction. Supination and pronation intact against resistance. Strength 5/5 through the cardinal directions of the bilateral wrists. Strength 5/5 with flexion and extension of the bilateral elbows. Patient can touch the thumb to each one of the fingertips without difficulty.   Skin: Skin is warm and dry. He is not diaphoretic. No pallor.  Psychiatric: He has a normal mood and affect. His behavior is normal.  Nursing note and vitals reviewed.    ED Treatments / Results  Labs (all labs ordered are listed, but only abnormal results are displayed) Labs Reviewed - No data to display  EKG None  Radiology Mr Cervical Spine W Or Wo Contrast  Result Date: 03/31/2018 CLINICAL DATA:  Initial evaluation for left-sided neck pain radiating into the left upper extremity for 36 hours. EXAM: MRI CERVICAL SPINE WITHOUT AND WITH CONTRAST TECHNIQUE: Multiplanar and multiecho pulse sequences of the cervical spine, to include the craniocervical junction and cervicothoracic junction, were obtained without and with intravenous contrast. CONTRAST:  20mL MULTIHANCE GADOBENATE DIMEGLUMINE 529 MG/ML IV SOLN COMPARISON:  None. FINDINGS: Alignment: Straightening of the normal cervical lordosis. No listhesis. Vertebrae: Vertebral body heights maintained without evidence for acute or chronic fracture. Bone marrow signal intensity within normal limits. No discrete or worrisome osseous lesions. No abnormal marrow edema or enhancement. Cord: Suspected subtle T2 cord signal abnormality within the cervical spinal cord at the level of C3-4 related to stenosis (series 4, image 7). Signal intensity within the cervical spinal cord is otherwise normal. Posterior Fossa, vertebral arteries, paraspinal tissues: Visualized brain and  posterior fossa within normal limits. Craniocervical junction normal. Paraspinous and prevertebral soft tissues demonstrate no acute finding. Normal intravascular flow voids present within the vertebral arteries bilaterally. Disc levels: C2-C3: Unremarkable. C3-C4: Broad left paracentral disc protrusion indents and effaces the central and left ventral thecal sac (series 6, image 17). Slight cephalad and caudad migration of disc material. Protruding disc flattens the cervical spinal cord with probable subtle cord signal abnormality. Moderate to severe spinal stenosis with the thecal sac measuring 6 mm in AP diameter at its most narrow point. Protruding disc extends laterally into the left neural foramen with associated severe left C4 foraminal stenosis. Superimposed uncovertebral spurring with moderate right foraminal narrowing as well. C4-C5: Small left paracentral disc protrusion indents the left ventral thecal sac. Mild flattening of the left hemi cord without cord signal changes. Mild spinal stenosis. Foramina remain patent. C5-C6: Left paracentral disc protrusion indents the left ventral thecal sac. Flattening of the left hemi cord without cord signal changes. Mild to moderate left-sided spinal stenosis. Foramina remain patent. C6-C7: Broad left paracentral disc  protrusion indents the central and left ventral thecal sac. Flattening of the left hemi cord without cord signal changes. Moderate left-sided spinal stenosis. Mild bilateral C7 foraminal stenosis. C7-T1: Mild facet hypertrophy on the right. No significant stenosis. Visualized upper thoracic spine demonstrates no significant finding. IMPRESSION: 1. Left paracentral disc protrusion at C3-4 with lateral extension into the left neural foramen, with resultant moderate to severe canal and severe left C4 foraminal stenosis. Subtle cord signal abnormality at this level suspicious for mild edema and/or myelomalacia. 2. Additional left paracentral disc protrusions  at C4-5, C5-6, and C6-7 with resultant mild to moderate spinal stenosis as above. Secondary cord flattening at these levels without cord signal changes. Electronically Signed   By: Rise MuBenjamin  McClintock M.D.   On: 03/31/2018 19:14    Procedures Procedures (including critical care time)  Medications Ordered in ED Medications  dexamethasone (DECADRON) injection 10 mg (10 mg Intramuscular Given 03/31/18 1604)  gabapentin (NEURONTIN) capsule 300 mg (300 mg Oral Given 03/31/18 1604)  HYDROcodone-acetaminophen (NORCO/VICODIN) 5-325 MG per tablet 2 tablet (2 tablets Oral Given 03/31/18 1604)  gadobenate dimeglumine (MULTIHANCE) injection 20 mL (20 mLs Intravenous Contrast Given 03/31/18 1856)     Initial Impression / Assessment and Plan / ED Course  I have reviewed the triage vital signs and the nursing notes.  Pertinent labs & imaging results that were available during my care of the patient were reviewed by me and considered in my medical decision making (see chart for details).     Patient presents with left neck and shoulder pain accompanied by sensation abnormalities in the left upper extremity.  Mild edema on MRI could account for the patient's acute symptoms.  No emergent findings noted.  Neurosurgery follow-up. The patient was given instructions for home care as well as return precautions. Patient voices understanding of these instructions, accepts the plan, and is comfortable with discharge.   Search of the  narcotic database reveals no narcotic prescriptions.  Final Clinical Impressions(s) / ED Diagnoses   Final diagnoses:  Cervical radiculopathy    ED Discharge Orders        Ordered    predniSONE (STERAPRED UNI-PAK 21 TAB) 10 MG (21) TBPK tablet     03/31/18 2047    gabapentin (NEURONTIN) 300 MG capsule  3 times daily     03/31/18 2047    HYDROcodone-acetaminophen (NORCO/VICODIN) 5-325 MG tablet  Every 6 hours PRN     03/31/18 2047       Anselm PancoastJoy, Cailin Gebel C, PA-C 03/31/18 2139      Linwood DibblesKnapp, Jon, MD 04/01/18 1451

## 2018-03-31 NOTE — ED Triage Notes (Signed)
Patient here from home with complaints of left neck pain radiating down into left arm x36 hours. Unable to sleep.States "I think I have a pinched nerve".

## 2018-03-31 NOTE — ED Notes (Signed)
Back from MRI.

## 2018-03-31 NOTE — Discharge Instructions (Signed)
°  Prednisone: Use the prednisone, as directed, until finished.  This medication is used to reduce swelling. Neurontin: Take this medication for nerve pain. Antiinflammatory medications: Take 600 mg of ibuprofen every 6 hours or 440 mg (over the counter dose) to 500 mg (prescription dose) of naproxen every 12 hours for the next 3 days. After this time, these medications may be used as needed for pain. Take these medications with food to avoid upset stomach. Choose only one of these medications, do not take them together. Tylenol: Should you continue to have additional pain while taking the ibuprofen or naproxen, you may add in tylenol as needed. Your daily total maximum amount of tylenol from all sources should be limited to 4000mg /day for persons without liver problems, or 2000mg /day for those with liver problems. Vicodin: May take Vicodin as needed for severe pain.  Do not drive or perform other dangerous activities while taking the Vicodin.  Please note that each pill of Vicodin contains 325 mg of Tylenol and the above dosage limits apply.  Follow-up: Follow-up with the neurosurgeon as soon as possible on this matter.  Call the number provided to set up an appointment. Return: Return to the ED for complete loss of sensation, loss of function, rapidly increasing weakness, significantly increased pain, or any other major concerns.

## 2018-04-17 ENCOUNTER — Other Ambulatory Visit (INDEPENDENT_AMBULATORY_CARE_PROVIDER_SITE_OTHER): Payer: Self-pay | Admitting: Orthopaedic Surgery

## 2018-04-17 ENCOUNTER — Encounter (INDEPENDENT_AMBULATORY_CARE_PROVIDER_SITE_OTHER): Payer: Self-pay | Admitting: Physician Assistant

## 2018-04-17 ENCOUNTER — Telehealth (INDEPENDENT_AMBULATORY_CARE_PROVIDER_SITE_OTHER): Payer: Self-pay

## 2018-04-17 ENCOUNTER — Other Ambulatory Visit (INDEPENDENT_AMBULATORY_CARE_PROVIDER_SITE_OTHER): Payer: Self-pay

## 2018-04-17 ENCOUNTER — Ambulatory Visit (INDEPENDENT_AMBULATORY_CARE_PROVIDER_SITE_OTHER): Payer: BLUE CROSS/BLUE SHIELD | Admitting: Physician Assistant

## 2018-04-17 DIAGNOSIS — M542 Cervicalgia: Secondary | ICD-10-CM

## 2018-04-17 DIAGNOSIS — M5010 Cervical disc disorder with radiculopathy, unspecified cervical region: Secondary | ICD-10-CM | POA: Diagnosis not present

## 2018-04-17 MED ORDER — HYDROCODONE-ACETAMINOPHEN 5-325 MG PO TABS: 1.0000 | ORAL_TABLET | Freq: Four times a day (QID) | ORAL | 0 refills | Status: DC | PRN

## 2018-04-17 MED ORDER — IBUPROFEN 800 MG PO TABS: 800.0000 mg | ORAL_TABLET | Freq: Three times a day (TID) | ORAL | 0 refills | Status: DC | PRN

## 2018-04-17 NOTE — Telephone Encounter (Signed)
Paul Huff forgot to give the patient his Rx for pain medication today at his ov. Would you please send this in?  Norco 5/325 mg #30 1 q 6 hrs prn moderate pain. [facility] [address].

## 2018-04-17 NOTE — Progress Notes (Signed)
Office Visit Note   Patient: Paul Huff           Date of Birth: Feb 23, 1971           MRN: [id number] Visit Date: 04/17/2018              Requested by: Shirline FreesNafziger, Cory, NP [address] [place], [place] [id number] PCP: Shirline FreesNafziger, Cory, NP   Assessment & Plan: Visit Diagnoses:  1. Cervical disc disorder with radiculopathy, unspecified cervical region     Plan: We will have Paul Huff continue his Flexeril at night.  He also refilled his ibuprofen.  He is given a small amount of Norco that he will use sparingly for neck pain.  We will send him for evaluation and possible C4 transforaminal injection with Dr. Alvester Morin[place].  Like to see him back 2 weeks after the injection to  check his response and next course of action.  Determine questions encouraged and answered at length.  Follow-Up Instructions: Return for 2 weeks after epidural steroid injection.   Orders:  No orders of the defined types were placed in this encounter.  Meds ordered this encounter  Medications  . HYDROcodone-acetaminophen (NORCO/VICODIN) 5-325 MG tablet    Sig: Take 1 tablet by mouth every 6 (six) hours as needed for moderate pain.    Dispense:  30 tablet    Refill:  0  . ibuprofen (ADVIL,MOTRIN) 800 MG tablet    Sig: Take 1 tablet (800 mg total) by mouth every 8 (eight) hours as needed.    Dispense:  30 tablet    Refill:  0      Procedures: No procedures performed   Clinical Data: No additional findings.   Subjective: Chief Complaint  Patient presents with  . Neck - Pain    HPI Paul Huff is a pleasant [age] year old male who comes in today with neck pain with radicular symptoms down the left arm.  He states he has had symptoms like this 5 years ago but it done well except for some tingling in his left thumb index and long finger until 3 to 4 weeks ago.  He states he was cooking out and heard something pop in his neck had shoulder pain.  He is now having tingling sensations down the  left arm.  Most of his severe pain is in the left shoulder to the posterior aspect of the elbow.  Pain does awaken him at night.  He did go to the ER where he was given Medrol Dosepak which really did not help and some hydrocodone which she is using sparingly.  He states he is having constant tingling in the left hand involving thumb index and middle finger.  Pain is 10 out of 10 at worst.  At times it feels like his whole left arm goes "dead".  In the past he is just taking ibuprofen for this.  He has had no formal therapy or any other type of conservative treatment. MRI lumbar spine dated 03/31/2018 shows a broad left paracentral disc protrusion at C3-C4.  The protruding disc flattens the cervical spinal cord with possible subtle cord signal abnormality.  Moderate to severe spinal stenosis with the thecal sac measuring 6 mm in AP diameter at the most narrow point.  Severe left C4 foraminal stenosis where the protruding disc extends laterally into the left neural foramina.  C4-C5 small left paracentral disc protrusion indents the ventral thecal sac.  Mild flattening of the left hemicord without cord signal changes.  C5-C6 paracentral left disc protrusion which causes some flattening of the left hemicord without cord signal changes.  Mild to moderate left sided spinal stenosis.  C6-C7 broad left paracentral disc protrusion with flattening of the left hemicord without signal changes of the cord.  Moderate left sided spinal stenosis mild bilateral foraminal C7 stenosis.  Review of Systems Denies fevers, chills, chest pain, shortness of breath, nausea or vomiting.  Objective: Vital Signs: There were no vitals taken for this visit.  Physical Exam  Constitutional: He is oriented to person, place, and time. He appears well-developed and well-nourished. No distress.  Pulmonary/Chest: Effort normal.  Neurological: He is alert and oriented to person, place, and time.  Skin: He is not diaphoretic.  Psychiatric: He  has a normal mood and affect.    Ortho Exam Cervical spine he has full flexion.  Limited extension pain.  Positive Spurling's.  Tenderness over the left medial scapular border in the left trapezius region.  Subjective decreased sensation left thumb index and middle finger.  Full motor bilateral hands.  Deep tendon reflexes 2+ at the distal biceps 1+ at the brachial radialis and triceps equals symmetric bilaterally.  5 out of 5 strength throughout the upper extremities against resistance.  Radial pulses are 2+ bilaterally.  Specialty Comments:  No specialty comments available.  Imaging: No results found.   PMFS History: There are no active problems to display for this patient.  Past Medical History:  Diagnosis Date  . Acid reflux   . Sciatica     Family History  Adopted: Yes    Past Surgical History:  Procedure Laterality Date  . TONSILLECTOMY  1985   Social History   Occupational History  . Not on file  Tobacco Use  . Smoking status: Former Games developer  . Smokeless tobacco: Never Used  Substance and Sexual Activity  . Alcohol use: Yes    Alcohol/week: 4.8 oz    Types: 8 Shots of liquor per week    Comment: social drinking on weekend  . Drug use: Yes    Types: Marijuana  . Sexual activity: Not Currently

## 2018-04-18 ENCOUNTER — Telehealth (INDEPENDENT_AMBULATORY_CARE_PROVIDER_SITE_OTHER): Payer: Self-pay | Admitting: Physician Assistant

## 2018-04-18 ENCOUNTER — Telehealth (INDEPENDENT_AMBULATORY_CARE_PROVIDER_SITE_OTHER): Payer: Self-pay | Admitting: *Deleted

## 2018-04-18 NOTE — Telephone Encounter (Signed)
Can we cancel her appt with Bronson CurbGil 05/01/18 and reschedule a week or two [facility] please

## 2018-04-18 NOTE — Telephone Encounter (Signed)
Patient called wanting to know if the Hydrocodone and the Ibuprofen can be used together.  He also stated that the Ibuprofen is not helping when he takes it by itself  CB#[phone].  Thank you.

## 2018-04-18 NOTE — Telephone Encounter (Signed)
Patient aware this will work better for him if he doesn't take them at the sametime, that way he has alittle something in his system all the time vs him taking them together every 6-8 hours

## 2018-04-21 ENCOUNTER — Telehealth (INDEPENDENT_AMBULATORY_CARE_PROVIDER_SITE_OTHER): Payer: Self-pay | Admitting: Physician Assistant

## 2018-04-21 MED ORDER — GABAPENTIN 300 MG PO CAPS: 300.0000 mg | ORAL_CAPSULE | Freq: Three times a day (TID) | ORAL | 0 refills | Status: DC

## 2018-04-21 NOTE — Telephone Encounter (Signed)
Please advise 

## 2018-04-21 NOTE — Telephone Encounter (Signed)
Patient rescheduled with Paul Huff 05/22/18 at 8:15am

## 2018-04-21 NOTE — Telephone Encounter (Signed)
I sent some more in to the pharmacy.

## 2018-04-21 NOTE — Telephone Encounter (Signed)
Thanks Margaret.

## 2018-04-21 NOTE — Telephone Encounter (Signed)
I called patient to advise.  

## 2018-04-21 NOTE — Telephone Encounter (Signed)
Called patient left message to return call to reschedule appointment after appointment with Dr Alvester MorinNewton

## 2018-04-21 NOTE — Telephone Encounter (Signed)
Patient called needing Rx refilled (Gabapentin) The number to contact patient is [phone]

## 2018-05-01 ENCOUNTER — Ambulatory Visit (INDEPENDENT_AMBULATORY_CARE_PROVIDER_SITE_OTHER): Payer: BLUE CROSS/BLUE SHIELD | Admitting: Physician Assistant

## 2018-05-04 ENCOUNTER — Ambulatory Visit (INDEPENDENT_AMBULATORY_CARE_PROVIDER_SITE_OTHER): Payer: Self-pay | Admitting: Physical Medicine and Rehabilitation

## 2018-05-09 ENCOUNTER — Ambulatory Visit (INDEPENDENT_AMBULATORY_CARE_PROVIDER_SITE_OTHER): Payer: BLUE CROSS/BLUE SHIELD | Admitting: Physical Medicine and Rehabilitation

## 2018-05-09 ENCOUNTER — Encounter (INDEPENDENT_AMBULATORY_CARE_PROVIDER_SITE_OTHER): Payer: Self-pay | Admitting: Physical Medicine and Rehabilitation

## 2018-05-09 ENCOUNTER — Telehealth (INDEPENDENT_AMBULATORY_CARE_PROVIDER_SITE_OTHER): Payer: Self-pay | Admitting: *Deleted

## 2018-05-09 ENCOUNTER — Other Ambulatory Visit (INDEPENDENT_AMBULATORY_CARE_PROVIDER_SITE_OTHER): Payer: Self-pay | Admitting: Physician Assistant

## 2018-05-09 VITALS — BP 145/102 | HR 62

## 2018-05-09 DIAGNOSIS — M542 Cervicalgia: Secondary | ICD-10-CM

## 2018-05-09 DIAGNOSIS — M5412 Radiculopathy, cervical region: Secondary | ICD-10-CM | POA: Diagnosis not present

## 2018-05-09 DIAGNOSIS — M7918 Myalgia, other site: Secondary | ICD-10-CM

## 2018-05-09 MED ORDER — HYDROCODONE-ACETAMINOPHEN 5-325 MG PO TABS: 1.0000 | ORAL_TABLET | Freq: Four times a day (QID) | ORAL | 0 refills | Status: DC | PRN

## 2018-05-09 NOTE — Telephone Encounter (Signed)
Please advise 

## 2018-05-09 NOTE — Progress Notes (Signed)
  Numeric Pain Rating Scale and Functional Assessment Average Pain 6 Pain Right Now 6 My pain is constant and aching Pain is worse with: Typing Pain improves with: Vicodin helps a little   In the last MONTH (on 0-10 scale) has pain interfered with the following?  1. General activity like being  able to carry out your everyday physical activities such as walking, climbing stairs, carrying groceries, or moving a chair?  Rating(10)  2. Relation with others like being able to carry out your usual social activities and roles such as  activities at home, at work and in your community. Rating(10)  3. Enjoyment of life such that you have  been bothered by emotional problems such as feeling anxious, depressed or irritable?  Rating(10)

## 2018-05-10 ENCOUNTER — Telehealth (INDEPENDENT_AMBULATORY_CARE_PROVIDER_SITE_OTHER): Payer: Self-pay | Admitting: *Deleted

## 2018-05-10 ENCOUNTER — Encounter (INDEPENDENT_AMBULATORY_CARE_PROVIDER_SITE_OTHER): Payer: Self-pay | Admitting: Physical Medicine and Rehabilitation

## 2018-05-10 NOTE — Progress Notes (Signed)
Paul Huff - 47 y.o. male MRN [id number]  Date of birth: 10/14/1970  Office Visit Note: Visit Date: 05/09/2018 PCP: Shirline Frees, NP Referred by: Shirline Frees, NP  Subjective: Chief Complaint  Patient presents with  . Neck - Pain  . Left Shoulder - Pain  . Left Hand - Pain, Numbness   HPI: Paul Huff is a very pleasant 47 year old left-hand-dominant male who comes in today with his wife who provides some of the history.  He was recently seen in our office by Rexene Edison, P.A.-C for evaluation of 6 weeks of left neck shoulder arm pain which seems to be radicular in nature with tingling and numbness.  He reports a history of 4 to 5 years ago of similar complaints that essentially resolved over time on their own but did give him quite a bit of pain at the time.  He did not have any imaging at the time or therapy or interventional treatment but did have medications.  On 03/31/2018 he did go to the emergency department with couple weeks of severe neck pain that was referring down the arm particular in the left elbow and also with numbness and tingling in a C6 distribution on the left and the thumb and index finger.  MRI of the cervical spine was obtained during that ER visit and this is reviewed with the patient today with spine models and with the pictures.  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  He is getting some right elbow pain at times.  All of his symptoms otherwise are on the left.  He really cannot get comfortable and his pain is really ongoing 24 hours a day.  He rates his average pain is a 6 out of 10.  He does get some relief with the pain medication which is Vicodin.  He reports this is constant aching pain.  Is somewhat worse with typing on the computer.  He has not had prior  electrodiagnostic studies.  He does not feel like he has had any real weakness although he cannot do a lot do to the pain.  He has had no bowel or bladder changes or balance difficulties.  He also has a history of what he refers to his sciatica on the right.  No MRI that he knows of on the lumbar spine.  Has been treated in the past conservatively for right radicular type pain.  He has had no focal lower extremity weakness.  He has had no fevers chills or night sweats or unintended weight loss.   Review of Systems  Constitutional: Negative for chills, fever, malaise/fatigue and weight loss.  HENT: Negative for hearing loss and sinus pain.   Eyes: Negative for blurred vision, double vision and photophobia.  Respiratory: Negative for cough and shortness of breath.   Cardiovascular: Negative for chest pain, palpitations and leg swelling.  Gastrointestinal: Negative for abdominal pain, nausea and vomiting.  Genitourinary: Negative for flank pain.  Musculoskeletal: Positive for neck pain. Negative for myalgias.       Pain numbness and tingling in the left arm and elbow in particular as well as tingling in the thumb and first digit.  Skin: Negative for itching and rash.  Neurological: Negative for tremors, focal weakness and weakness.  Endo/Heme/Allergies: Negative.   Psychiatric/Behavioral: Negative for  depression.  All other systems reviewed and are negative.  Otherwise per HPI.  Assessment & Plan: Visit Diagnoses:  1. Cervicalgia   2. Cervical radiculopathy   3. Myofascial pain syndrome     Plan: Findings:  6 weeks of chronic recalcitrant left neck and shoulder pain quite severe at times which is really only relieved temporarily with some hydrocodone.  He has had Medrol Dosepak without relief.  Exam is surprisingly nonfocal other than positive Spurling's test and some dysesthesia in the C6 distribution on the left.  He does not have any exam signs consistent with carpal tunnel syndrome  although this cannot be ruled out in the same distribution of his hand for what would be referred to is a double crush phenomena.  Given the severity of the imaging findings I think the best plan is to complete cervical epidural injection to try to get him some relief and we can do this safely and we discussed this at length with him and they do want to proceed with this.  We will give him preprocedure Valium or triazolam for some minor oral sedation.  I also want him to see Dr. Otelia SergeantNitka in our office for surgical evaluation.  We did obtain an appointment for him to see Dr. Otelia SergeantNitka.  He will continue current medication.  We will send a note to Rexene EdisonGil Clark for renewal of his hydrocodone but I would also be happy to renew this if needed temporarily given his current situation.  Unfortunately with a level of stenosis that he has and his age of 47 and may be more of a preemptive type surgery so that there is nothing down the road such as a central cord syndrome or other issues that could arise with even mild trauma.  Unfortunately has multilevel issues.  The injection hopefully would be diagnostic and therapeutic.  This would be a C7-T1 interlaminar epidural injection to the left using fluoroscopic guidance.  The patient has failed conservative care with activity modifications as well as medications.  He has had similar episode several years ago and I think this is been an ongoing problem.  His pain is very severe at this point and I do think the injection would give him some relief.  Consideration of electrodiagnostic study of the left upper extremity to look for double crush phenomenon.    Meds & Orders: No orders of the defined types were placed in this encounter.  No orders of the defined types were placed in this encounter.   Follow-up: Return for Left C7-T1 interlaminar epidural steroid injection.   Procedures: No procedures performed  No notes on file   Clinical History: MRI CERVICAL SPINE WITHOUT AND WITH  CONTRAST  TECHNIQUE: Multiplanar and multiecho pulse sequences of the cervical spine, to include the craniocervical junction and cervicothoracic junction, were obtained without and with intravenous contrast.  CONTRAST:  20mL MULTIHANCE GADOBENATE DIMEGLUMINE 529 MG/ML IV SOLN  COMPARISON:  None.  FINDINGS: Alignment: Straightening of the normal cervical lordosis. No listhesis.  Vertebrae: Vertebral body heights maintained without evidence for acute or chronic fracture. Bone marrow signal intensity within normal limits. No discrete or worrisome osseous lesions. No abnormal marrow edema or enhancement.  Cord: Suspected subtle T2 cord signal abnormality within the cervical spinal cord at the level of C3-4 related to stenosis (series 4, image 7). Signal intensity within the cervical spinal cord is otherwise normal.  Posterior Fossa, vertebral arteries, paraspinal tissues: Visualized brain and posterior fossa within normal limits. Craniocervical junction normal. Paraspinous  and prevertebral soft tissues demonstrate no acute finding. Normal intravascular flow voids present within the vertebral arteries bilaterally.  Disc levels:  C2-C3: Unremarkable.  C3-C4: Broad left paracentral disc protrusion indents and effaces the central and left ventral thecal sac (series 6, image 17). Slight cephalad and caudad migration of disc material. Protruding disc flattens the cervical spinal cord with probable subtle cord signal abnormality. Moderate to severe spinal stenosis with the thecal sac measuring 6 mm in AP diameter at its most narrow point. Protruding disc extends laterally into the left neural foramen with associated severe left C4 foraminal stenosis. Superimposed uncovertebral spurring with moderate right foraminal narrowing as well.  C4-C5: Small left paracentral disc protrusion indents the left ventral thecal sac. Mild flattening of the left hemi cord without cord  signal changes. Mild spinal stenosis. Foramina remain patent.  C5-C6: Left paracentral disc protrusion indents the left ventral thecal sac. Flattening of the left hemi cord without cord signal changes. Mild to moderate left-sided spinal stenosis. Foramina remain patent.  C6-C7: Broad left paracentral disc protrusion indents the central and left ventral thecal sac. Flattening of the left hemi cord without cord signal changes. Moderate left-sided spinal stenosis. Mild bilateral C7 foraminal stenosis.  C7-T1: Mild facet hypertrophy on the right. No significant stenosis.  Visualized upper thoracic spine demonstrates no significant finding.  IMPRESSION: 1. Left paracentral disc protrusion at C3-4 with lateral extension into the left neural foramen, with resultant moderate to severe canal and severe left C4 foraminal stenosis. Subtle cord signal abnormality at this level suspicious for mild edema and/or myelomalacia. 2. Additional left paracentral disc protrusions at C4-5, C5-6, and C6-7 with resultant mild to moderate spinal stenosis as above. Secondary cord flattening at these levels without cord signal changes.   Electronically Signed   By: Rise Mu M.D.   On: 03/31/2018 19:14   He reports that he has quit smoking. He has never used smokeless tobacco.  Recent Labs    05/10/17 0732  HGBA1C 5.4    Objective:  VS:  HT:    WT:   BMI:     BP:(!) 145/102  HR:62bpm  TEMP: ( )  RESP:  Physical Exam  Constitutional: He is oriented to person, place, and time. He appears well-developed and well-nourished. No distress.  HENT:  Head: Normocephalic and atraumatic.  Nose: Nose normal.  Mouth/Throat: Oropharynx is clear and moist.  Eyes: Pupils are equal, round, and reactive to light. Conjunctivae are normal.  Neck: Normal range of motion. Neck supple. No tracheal deviation present.  Cardiovascular: Regular rhythm and intact distal pulses.  Pulmonary/Chest:  Effort normal and breath sounds normal.  Abdominal: Soft. He exhibits no distension. There is no rebound and no guarding.  Musculoskeletal: He exhibits no deformity.  Patient has positive trigger point in the supraspinatus as well as rhomboid in the left upper back.  He has no impingement of the shoulders.  He has pain and limited range of motion with left rotation more than right of the cervical spine.  Positive Spurling's test on the left.  He actually has fairly good strength in the upper extremities bilaterally with shoulder abduction and elbow flexion and extension.  He has 2+ muscle stretch reflexes bilaterally at the biceps and brachioradialis.  He has a negative Tinel's test bilaterally.  Neurological: He is alert and oriented to person, place, and time. A sensory deficit is present. He exhibits normal muscle tone. Coordination normal.  Patient has a positive Spurling's test on the left.  He  also has decreased or impaired sensation in the C6 dermatome on the left.  Skin: Skin is warm. No rash noted.  Psychiatric: He has a normal mood and affect. His behavior is normal.  Nursing note and vitals reviewed.   Ortho Exam Imaging: No results found.  Past Medical/Family/Surgical/Social History: Medications & Allergies reviewed per EMR, new medications updated. There are no active problems to display for this patient.  Past Medical History:  Diagnosis Date  . Acid reflux   . Sciatica    Family History  Adopted: Yes   Past Surgical History:  Procedure Laterality Date  . TONSILLECTOMY  1985   Social History   Occupational History  . Not on file  Tobacco Use  . Smoking status: Former Games developermoker  . Smokeless tobacco: Never Used  Substance and Sexual Activity  . Alcohol use: Yes    Alcohol/week: 8.0 standard drinks    Types: 8 Shots of liquor per week    Comment: social drinking on weekend  . Drug use: Yes    Types: Marijuana  . Sexual activity: Not Currently

## 2018-05-10 NOTE — Telephone Encounter (Signed)
done

## 2018-05-10 NOTE — Telephone Encounter (Signed)
Sent clinical notes to [employer] at [phone] at [phone]. Pending Authorization.

## 2018-05-11 ENCOUNTER — Encounter (INDEPENDENT_AMBULATORY_CARE_PROVIDER_SITE_OTHER): Payer: BLUE CROSS/BLUE SHIELD | Admitting: Physical Medicine and Rehabilitation

## 2018-05-11 ENCOUNTER — Telehealth (INDEPENDENT_AMBULATORY_CARE_PROVIDER_SITE_OTHER): Payer: Self-pay | Admitting: *Deleted

## 2018-05-11 NOTE — Telephone Encounter (Signed)
Called pt to get r/s for appt. Pt would like to cancel appt for 05/11/18 @ 1515.

## 2018-05-17 ENCOUNTER — Telehealth (INDEPENDENT_AMBULATORY_CARE_PROVIDER_SITE_OTHER): Payer: Self-pay

## 2018-05-17 NOTE — Telephone Encounter (Signed)
LMOM for patient letting him know this was written for him on the 13th and is still at the front desk

## 2018-05-17 NOTE — Telephone Encounter (Signed)
Pt calling back regarding needing a refill on Vicodin, Says he saw Dr. Alvester MorinNewton last week and [facility]

## 2018-05-22 ENCOUNTER — Ambulatory Visit (INDEPENDENT_AMBULATORY_CARE_PROVIDER_SITE_OTHER): Payer: BLUE CROSS/BLUE SHIELD | Admitting: Physician Assistant

## 2018-06-07 ENCOUNTER — Encounter (INDEPENDENT_AMBULATORY_CARE_PROVIDER_SITE_OTHER): Payer: Self-pay | Admitting: Specialist

## 2018-06-07 ENCOUNTER — Ambulatory Visit (INDEPENDENT_AMBULATORY_CARE_PROVIDER_SITE_OTHER): Payer: BLUE CROSS/BLUE SHIELD | Admitting: Specialist

## 2018-06-07 ENCOUNTER — Ambulatory Visit (INDEPENDENT_AMBULATORY_CARE_PROVIDER_SITE_OTHER): Payer: Self-pay

## 2018-06-07 VITALS — BP 116/80 | HR 69 | Ht 71.0 in | Wt 248.0 lb

## 2018-06-07 DIAGNOSIS — M4712 Other spondylosis with myelopathy, cervical region: Secondary | ICD-10-CM

## 2018-06-07 DIAGNOSIS — M5010 Cervical disc disorder with radiculopathy, unspecified cervical region: Secondary | ICD-10-CM

## 2018-06-07 DIAGNOSIS — M542 Cervicalgia: Secondary | ICD-10-CM | POA: Diagnosis not present

## 2018-06-07 DIAGNOSIS — M5412 Radiculopathy, cervical region: Secondary | ICD-10-CM

## 2018-06-07 DIAGNOSIS — G5602 Carpal tunnel syndrome, left upper limb: Secondary | ICD-10-CM | POA: Diagnosis not present

## 2018-06-07 DIAGNOSIS — G959 Disease of spinal cord, unspecified: Secondary | ICD-10-CM

## 2018-06-07 MED ORDER — GABAPENTIN 300 MG PO CAPS: 300.0000 mg | ORAL_CAPSULE | Freq: Three times a day (TID) | ORAL | 0 refills | Status: DC

## 2018-06-07 MED ORDER — HYDROCODONE-ACETAMINOPHEN 5-325 MG PO TABS: 1.0000 | ORAL_TABLET | Freq: Four times a day (QID) | ORAL | 0 refills | Status: DC | PRN

## 2018-06-07 NOTE — Progress Notes (Signed)
Office Visit Note   Patient: Paul Huff           Date of Birth: Jan 02, 1971           MRN: [id number] Visit Date: 06/07/2018              Requested by: Shirline Frees, NP [address] [place], [place] [address] PCP: Shirline Frees, NP   Assessment & Plan: Visit Diagnoses:  1. Neck pain   2. Herniation of cervical intervertebral disc with radiculopathy   3. 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 Carpal tunnel syndrome, left upper limb     Plan: Avoid overhead lifting and overhead use of the arms. Do not lift greater than 10 lbs. Adjust head rest in vehicle to prevent hyperextension if rear ended. Take extra precautions to avoid falling. Will order a EMG/NCV to assess for any changes at the other levels C4-5 through C6-7. Schedule an appointment to see Dr. Ophelia Charter to consider a Cervical disc arthroplasty (rtificial disc replacement for a large disc herniation centrally C3-4, young patient with no spondylosis at the level of disc herniation but multiple other levels that show changes.    Follow-Up Instructions: Return in about 3 weeks (around 06/28/2018) for Dr. Ophelia Charter in 2-3 with EMG/NCVs. .   Orders:  Orders Placed This Encounter  Procedures  . XR Cervical Spine 2 or 3 views  . Ambulatory referral to Physical Medicine Rehab   Meds ordered this encounter  Medications  . HYDROcodone-acetaminophen (NORCO/VICODIN) 5-325 MG tablet    Sig: Take 1 tablet by mouth every 6 (six) hours as needed for moderate pain.    Dispense:  30 tablet    Refill:  0  . gabapentin (NEURONTIN) 300 MG capsule    Sig: Take 1 capsule (300 mg total) by mouth 3 (three) times daily for 15 days.    Dispense:  60 capsule    Refill:  0      Procedures: No procedures performed   Clinical Data: No additional findings.   Subjective: Chief Complaint  Patient presents with  . Neck - Pain    47  Year old right handed male with history of neck pain with  pain in the left arm. He has pain in the left neck and left face. It is excruciating when present and radiates along the left neck and left jaw and left neck. He does have some intermittant chest pain associated with this. He has seen primary care and had a workup of his heart, no stress test had blood work and EKG was done. He experiences worsening pain with upward gaze and then has some numbness into the left shoulder, also sometimes difficulty with raising the left arm and using it over head. No bowel or bladder difficulty, He is walking well. No increase in clumbsiness.  Woth coughing or sneezing he does have an impact like sensation. No leg numbness or weakness. 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    Review of Systems  Constitutional: Negative.  Negative for activity change, appetite change, chills, diaphoresis, fatigue, fever and unexpected weight change.  HENT: Negative.  Negative for congestion, dental problem, drooling, ear discharge, ear pain, facial swelling, hearing loss, mouth sores, nosebleeds, postnasal drip, rhinorrhea, sinus pressure, sinus pain, sneezing,  sore throat, tinnitus, trouble swallowing and voice change.   Eyes: Negative.  Negative for photophobia, pain, discharge, redness, itching and visual disturbance.  Respiratory: Negative.  Negative for apnea, cough, choking, chest tightness, shortness of breath, wheezing and stridor.   Cardiovascular: Negative.  Negative for chest pain, palpitations and leg swelling.  Gastrointestinal: Negative.   Endocrine: Negative.   Genitourinary: Negative.   Musculoskeletal: Positive for back pain, neck pain and neck stiffness.  Skin: Negative.   Allergic/Immunologic: Negative.  Negative for environmental allergies, food allergies and immunocompromised state.    Neurological: Negative for dizziness, tremors, seizures, syncope, facial asymmetry, speech difficulty, weakness, light-headedness, numbness and headaches.  Hematological: Negative.  Negative for adenopathy. Does not bruise/bleed easily.  Psychiatric/Behavioral: Negative.  Negative for agitation, behavioral problems, confusion, decreased concentration, dysphoric mood, hallucinations, self-injury and sleep disturbance. The patient is not nervous/anxious and is not hyperactive.      Objective: Vital Signs: BP 116/80 (BP Location: Left Arm, Patient Position: Sitting)   Pulse 69   Ht 5\' 11"  (1.803 m)   Wt 248 lb (112.5 kg)   BMI 34.59 kg/m   Physical Exam  Constitutional: He is oriented to person, place, and time. He appears well-developed and well-nourished.  HENT:  Head: Normocephalic and atraumatic.  Eyes: Pupils are equal, round, and reactive to light. EOM are normal.  Neck: Normal range of motion. Neck supple.  Pulmonary/Chest: Effort normal and breath sounds normal.  Abdominal: Soft. Bowel sounds are normal.  Neurological: He is alert and oriented to person, place, and time.  Skin: Skin is warm and dry.  Psychiatric: He has a normal mood and affect. His behavior is normal. Judgment and thought content normal.    Back Exam   Tenderness  The patient is experiencing tenderness in the cervical.  Range of Motion  Extension: abnormal  Flexion: normal  Lateral bend right: abnormal  Lateral bend left: abnormal   Muscle Strength  Right Quadriceps:  5/5  Left Quadriceps:  5/5  Right Hamstrings:  5/5  Left Hamstrings:  5/5   Tests  Straight leg raise right: negative Straight leg raise left: negative  Reflexes  Patellar: normal Achilles: normal Biceps: abnormal Babinski's sign: normal   Other  Toe walk: normal Heel walk: normal Sensation: normal Gait: normal  Erythema: no back redness Scars: absent      Specialty Comments:  No specialty comments  available.  Imaging: Xr Cervical Spine 2 Or 3 Views  Result Date: 06/07/2018 AP and lateral flexion and extension radiographs of the cervical spine show mild degenerative disc narrowing C5-6 and C6-7. No significant listhesis.    PMFS History: There are no active problems to display for this patient.  Past Medical History:  Diagnosis Date  . Acid reflux   . Sciatica     Family History  Adopted: Yes    Past Surgical History:  Procedure Laterality Date  . TONSILLECTOMY  1985   Social History   Occupational History  . Not on file  Tobacco Use  . Smoking status: Former Games developer  . Smokeless tobacco: Never Used  Substance and Sexual Activity  . Alcohol use: Yes    Alcohol/week: 8.0 standard drinks    Types: 8 Shots of liquor per week    Comment: social drinking on weekend  . Drug use: Yes    Types: Marijuana  . Sexual activity: Not Currently

## 2018-06-07 NOTE — Patient Instructions (Addendum)
Avoid overhead lifting and overhead use of the arms. Do not lift greater than 10 lbs. Adjust head rest in vehicle to prevent hyperextension if rear ended. Take extra precautions to avoid falling. Will order a EMG/NCV to assess for any changes at the other levels C4-5 through C6-7.Dr. Alvester Morin performs these studies and his secretary will contact you to have this study scheduled. Schedule an appointment to see Dr. Ophelia Charter to consider a Cervical disc arthroplasty (rtificial disc replacement for a large disc herniation centrally C3-4, young patient with no spondylosis at the level of disc herniation but multiple other levels that show changes.

## 2018-06-28 ENCOUNTER — Ambulatory Visit (INDEPENDENT_AMBULATORY_CARE_PROVIDER_SITE_OTHER): Payer: BLUE CROSS/BLUE SHIELD | Admitting: Orthopaedic Surgery

## 2018-06-29 ENCOUNTER — Encounter (INDEPENDENT_AMBULATORY_CARE_PROVIDER_SITE_OTHER): Payer: Self-pay | Admitting: Physical Medicine and Rehabilitation

## 2018-06-29 ENCOUNTER — Ambulatory Visit (INDEPENDENT_AMBULATORY_CARE_PROVIDER_SITE_OTHER): Payer: BLUE CROSS/BLUE SHIELD | Admitting: Physical Medicine and Rehabilitation

## 2018-06-29 DIAGNOSIS — R202 Paresthesia of skin: Secondary | ICD-10-CM | POA: Diagnosis not present

## 2018-06-29 NOTE — Progress Notes (Signed)
 .  Numeric Pain Rating Scale and Functional Assessment Average Pain 10   In the last MONTH (on 0-10 scale) has pain interfered with the following?  1. General activity like being  able to carry out your everyday physical activities such as walking, climbing stairs, carrying groceries, or moving a chair?  Rating(6)    

## 2018-06-30 ENCOUNTER — Ambulatory Visit (INDEPENDENT_AMBULATORY_CARE_PROVIDER_SITE_OTHER): Payer: Self-pay | Admitting: Orthopaedic Surgery

## 2018-07-04 NOTE — Procedures (Signed)
EMG & NCV Findings: Evaluation of the left median (across palm) sensory nerve showed prolonged distal peak latency (Wrist, 3.7 ms).  All remaining nerves (as indicated in the following tables) were within normal limits.    All examined muscles (as indicated in the following table) showed no evidence of electrical instability.    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   There is no significant electrodiagnostic evidence of other focal nerve injury, cervical radiculopathy or brachial plexopathy.  Clinically I do not feel like the level of symptoms that he is having would be consistent with a borderline median neuropathy at the wrist.  **As you know, this particular electrodiagnostic study cannot rule out chemical radiculitis or sensory only radiculopathy.  Recommendations: 1.  Follow-up with referring physician. 2.  Continue current management of symptoms.  Clinically still seems to be related to his cervical spine. 3.  Continue use of resting splint at night-time and as needed during the day.  ___________________________ Elease Hashimoto Board Certified, American Board of Physical Medicine and Rehabilitation    Nerve Conduction Studies Anti Sensory Summary Table   Stim Site NR Peak (ms) Norm Peak (ms) P-T Amp (V) Norm P-T Amp Site1 Site2 Delta-P (ms) Dist (cm) Vel (m/s) Norm Vel (m/s)  Left Median Acr Palm Anti Sensory (2nd Digit)  32.1C  Wrist    *3.7 <3.6 18.7 >10 Wrist Palm 1.9 0.0    Palm    1.8 <2.0 43.8         Left Radial Anti Sensory (Base 1st Digit)  32.1C  Wrist    1.8 <3.1 37.1  Wrist Base 1st Digit 1.8 0.0    Left Ulnar Anti Sensory (5th Digit)  32.3C  Wrist    2.8 <3.7 33.3 >15.0 Wrist 5th Digit 2.8 14.0 50 >38   Motor Summary Table   Stim Site NR Onset (ms) Norm Onset (ms) O-P Amp (mV) Norm O-P  Amp Site1 Site2 Delta-0 (ms) Dist (cm) Vel (m/s) Norm Vel (m/s)  Left Median Motor (Abd Poll Brev)  32.1C  Wrist    4.1 <4.2 11.5 >5 Elbow Wrist 4.2 26.0 62 >50  Elbow    8.3  10.8         Left Ulnar Motor (Abd Dig Min)  32.1C  Wrist    2.7 <4.2 5.0 >3 B Elbow Wrist 3.7 23.0 62 >53  B Elbow    6.4  10.2  A Elbow B Elbow 1.2 10.0 83 >53  A Elbow    7.6  9.8          EMG   Side Muscle Nerve Root Ins Act Fibs Psw Amp Dur Poly Recrt Int Dennie Bible Comment  Left 1stDorInt Ulnar C8-T1 Nml Nml Nml Nml Nml 0 Nml Nml   Left Abd Poll Brev Median C8-T1 Nml Nml Nml Nml Nml 0 Nml Nml   Left ExtDigCom   Nml Nml Nml Nml Nml 0 Nml Nml   Left Triceps Radial C6-7-8 Nml Nml Nml Nml Nml 0 Nml Nml   Left Deltoid Axillary C5-6 Nml Nml Nml Nml Nml 0 Nml Nml     Nerve Conduction Studies Anti Sensory Left/Right Comparison   Stim Site L Lat (ms) R Lat (ms) L-R Lat (ms) L Amp (V) R Amp (V) L-R Amp (%) Site1 Site2 L Vel (m/s) R Vel (m/s) L-R Vel (m/s)  Median Acr  Palm Anti Sensory (2nd Digit)  32.1C  Wrist *3.7   18.7   Wrist Palm     Palm 1.8   43.8         Radial Anti Sensory (Base 1st Digit)  32.1C  Wrist 1.8   37.1   Wrist Base 1st Digit     Ulnar Anti Sensory (5th Digit)  32.3C  Wrist 2.8   33.3   Wrist 5th Digit 50     Motor Left/Right Comparison   Stim Site L Lat (ms) R Lat (ms) L-R Lat (ms) L Amp (mV) R Amp (mV) L-R Amp (%) Site1 Site2 L Vel (m/s) R Vel (m/s) L-R Vel (m/s)  Median Motor (Abd Poll Brev)  32.1C  Wrist 4.1   11.5   Elbow Wrist 62    Elbow 8.3   10.8         Ulnar Motor (Abd Dig Min)  32.1C  Wrist 2.7   5.0   B Elbow Wrist 62    B Elbow 6.4   10.2   A Elbow B Elbow 83    A Elbow 7.6   9.8            Waveforms:

## 2018-07-04 NOTE — Progress Notes (Signed)
Paul Huff - 47 y.o. male MRN [id number]  Date of birth: 1971-02-06  Office Visit Note: Visit Date: 06/29/2018 PCP: Shirline Frees, NP Referred by: Shirline Frees, NP  Subjective: Chief Complaint  Patient presents with  . Left Arm - Pain  . Left Hand - Numbness, Tingling   HPI: Paul Huff is a 47 y.o. male who comes in today at the request of Dr. Vira Browns for electrodiagnostic study of the left upper limb.  Patient is left-hand dominant.  I actually saw the patient back in August for the same problem and felt like he may have a double crush type phenomenon with clearly disc herniation probably surgical.  He does have neck pain that radiates and refers to the left arm.  He has paresthesias numbness and tingling in the left hand and the thumb and index and middle finger consistent either with a C6 radiculopathy or a carpal tunnel syndrome.  Symptoms are gotten worse over the past couple months since of seen him.  He is using Flexeril as well as hydrocodone and meloxicam and gabapentin really without major relief but some relief.  He rates his pain as a 10 out of 10.  No prior electrodiagnostic studies.  ROS Otherwise per HPI.  Assessment & Plan: Visit Diagnoses:  1. Paresthesia of skin     Plan: Impression: The above electrodiagnostic study is SLIGHTLY ABNORMAL and reveals evidence of a borderline to mild left median nerve entrapment at the wrist (carpal tunnel syndrome) affecting sensory components.   There is no significant electrodiagnostic evidence of other focal nerve injury, cervical radiculopathy or brachial plexopathy.  Clinically I do not feel like the level of symptoms that he is having would be consistent with a borderline median neuropathy at the wrist.  **As you know, this particular electrodiagnostic study cannot rule out chemical radiculitis or sensory only radiculopathy.  Recommendations: 1.  Follow-up with referring physician. 2.  Continue  current management of symptoms.  Clinically still seems to be related to his cervical spine. 3.  Continue use of resting splint at night-time and as needed during the day.    Meds & Orders: No orders of the defined types were placed in this encounter.   Orders Placed This Encounter  Procedures  . NCV with EMG (electromyography)    Follow-up: Return for Annell Greening, M.D..   Procedures: No procedures performed  EMG & NCV Findings: Evaluation of the left median (across palm) sensory nerve showed prolonged distal peak latency (Wrist, 3.7 ms).  All remaining nerves (as indicated in the following tables) were within normal limits.    All examined muscles (as indicated in the following table) showed no evidence of electrical instability.    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   There is no significant electrodiagnostic evidence of other focal nerve injury, cervical radiculopathy or brachial plexopathy.  Clinically I do not feel like the level of symptoms that he is having would be consistent with a borderline median neuropathy at the wrist.  **As you know, this particular electrodiagnostic study cannot rule out chemical radiculitis or sensory only radiculopathy.  Recommendations: 1.  Follow-up with referring physician. 2.  Continue current management of symptoms.  Clinically still seems to be related to his cervical spine. 3.  Continue use of resting splint at night-time and as needed during the day.  ___________________________  Naaman Plummer FAAPMR Board Certified, American Board of Physical Medicine and Rehabilitation    Nerve Conduction Studies Anti Sensory Summary Table   Stim Site NR Peak (ms) Norm Peak (ms) P-T Amp (V) Norm P-T Amp Site1 Site2 Delta-P (ms) Dist (cm) Vel (m/s) Norm Vel (m/s)  Left Median Acr  Palm Anti Sensory (2nd Digit)  32.1C  Wrist    *3.7 <3.6 18.7 >10 Wrist Palm 1.9 0.0    Palm    1.8 <2.0 43.8         Left Radial Anti Sensory (Base 1st Digit)  32.1C  Wrist    1.8 <3.1 37.1  Wrist Base 1st Digit 1.8 0.0    Left Ulnar Anti Sensory (5th Digit)  32.3C  Wrist    2.8 <3.7 33.3 >15.0 Wrist 5th Digit 2.8 14.0 50 >38   Motor Summary Table   Stim Site NR Onset (ms) Norm Onset (ms) O-P Amp (mV) Norm O-P Amp Site1 Site2 Delta-0 (ms) Dist (cm) Vel (m/s) Norm Vel (m/s)  Left Median Motor (Abd Poll Brev)  32.1C  Wrist    4.1 <4.2 11.5 >5 Elbow Wrist 4.2 26.0 62 >50  Elbow    8.3  10.8         Left Ulnar Motor (Abd Dig Min)  32.1C  Wrist    2.7 <4.2 5.0 >3 B Elbow Wrist 3.7 23.0 62 >53  B Elbow    6.4  10.2  A Elbow B Elbow 1.2 10.0 83 >53  A Elbow    7.6  9.8          EMG   Side Muscle Nerve Root Ins Act Fibs Psw Amp Dur Poly Recrt Int Dennie Bible Comment  Left 1stDorInt Ulnar C8-T1 Nml Nml Nml Nml Nml 0 Nml Nml   Left Abd Poll Brev Median C8-T1 Nml Nml Nml Nml Nml 0 Nml Nml   Left ExtDigCom   Nml Nml Nml Nml Nml 0 Nml Nml   Left Triceps Radial C6-7-8 Nml Nml Nml Nml Nml 0 Nml Nml   Left Deltoid Axillary C5-6 Nml Nml Nml Nml Nml 0 Nml Nml     Nerve Conduction Studies Anti Sensory Left/Right Comparison   Stim Site L Lat (ms) R Lat (ms) L-R Lat (ms) L Amp (V) R Amp (V) L-R Amp (%) Site1 Site2 L Vel (m/s) R Vel (m/s) L-R Vel (m/s)  Median Acr Palm Anti Sensory (2nd Digit)  32.1C  Wrist *3.7   18.7   Wrist Palm     Palm 1.8   43.8         Radial Anti Sensory (Base 1st Digit)  32.1C  Wrist 1.8   37.1   Wrist Base 1st Digit     Ulnar Anti Sensory (5th Digit)  32.3C  Wrist 2.8   33.3   Wrist 5th Digit 50     Motor Left/Right Comparison   Stim Site L Lat (ms) R Lat (ms) L-R Lat (ms) L Amp (mV) R Amp (mV) L-R Amp (%) Site1 Site2 L Vel (m/s) R Vel (m/s) L-R Vel (m/s)  Median Motor (Abd Poll Brev)  32.1C  Wrist 4.1   11.5   Elbow Wrist 62    Elbow 8.3   10.8         Ulnar  Motor (Abd Dig Min)  32.1C  Wrist 2.7   5.0   B Elbow Wrist 62    B Elbow 6.4   10.2   A Elbow B Elbow 83  A Elbow 7.6   9.8            Waveforms:            Clinical History: MRI CERVICAL SPINE WITHOUT AND WITH CONTRAST  TECHNIQUE: Multiplanar and multiecho pulse sequences of the cervical spine, to include the craniocervical junction and cervicothoracic junction, were obtained without and with intravenous contrast.  CONTRAST:  20mL MULTIHANCE GADOBENATE DIMEGLUMINE 529 MG/ML IV SOLN  COMPARISON:  None.  FINDINGS: Alignment: Straightening of the normal cervical lordosis. No listhesis.  Vertebrae: Vertebral body heights maintained without evidence for acute or chronic fracture. Bone marrow signal intensity within normal limits. No discrete or worrisome osseous lesions. No abnormal marrow edema or enhancement.  Cord: Suspected subtle T2 cord signal abnormality within the cervical spinal cord at the level of C3-4 related to stenosis (series 4, image 7). Signal intensity within the cervical spinal cord is otherwise normal.  Posterior Fossa, vertebral arteries, paraspinal tissues: Visualized brain and posterior fossa within normal limits. Craniocervical junction normal. Paraspinous and prevertebral soft tissues demonstrate no acute finding. Normal intravascular flow voids present within the vertebral arteries bilaterally.  Disc levels:  C2-C3: Unremarkable.  C3-C4: Broad left paracentral disc protrusion indents and effaces the central and left ventral thecal sac (series 6, image 17). Slight cephalad and caudad migration of disc material. Protruding disc flattens the cervical spinal cord with probable subtle cord signal abnormality. Moderate to severe spinal stenosis with the thecal sac measuring 6 mm in AP diameter at its most narrow point. Protruding disc extends laterally into the left neural foramen with associated severe left C4 foraminal stenosis.  Superimposed uncovertebral spurring with moderate right foraminal narrowing as well.  C4-C5: Small left paracentral disc protrusion indents the left ventral thecal sac. Mild flattening of the left hemi cord without cord signal changes. Mild spinal stenosis. Foramina remain patent.  C5-C6: Left paracentral disc protrusion indents the left ventral thecal sac. Flattening of the left hemi cord without cord signal changes. Mild to moderate left-sided spinal stenosis. Foramina remain patent.  C6-C7: Broad left paracentral disc protrusion indents the central and left ventral thecal sac. Flattening of the left hemi cord without cord signal changes. Moderate left-sided spinal stenosis. Mild bilateral C7 foraminal stenosis.  C7-T1: Mild facet hypertrophy on the right. No significant stenosis.  Visualized upper thoracic spine demonstrates no significant finding.  IMPRESSION: 1. Left paracentral disc protrusion at C3-4 with lateral extension into the left neural foramen, with resultant moderate to severe canal and severe left C4 foraminal stenosis. Subtle cord signal abnormality at this level suspicious for mild edema and/or myelomalacia. 2. Additional left paracentral disc protrusions at C4-5, C5-6, and C6-7 with resultant mild to moderate spinal stenosis as above. Secondary cord flattening at these levels without cord signal changes.   Electronically Signed   By: Rise Mu M.D.   On: 03/31/2018 19:14   He reports that he has quit smoking. He has never used smokeless tobacco. No results for input(s): HGBA1C, LABURIC in the last 8760 hours.  Objective:  VS:  HT:    WT:   BMI:     BP:   HR: bpm  TEMP: ( )  RESP:  Physical Exam  Constitutional: He is oriented to person, place, and time.  Musculoskeletal: He exhibits no edema or tenderness.  Inspection reveals no atrophy of the bilateral APB or FDI or hand intrinsics. There is no swelling, color changes, allodynia  or dystrophic changes. There is 5 out of 5 strength in  the bilateral wrist extension, finger abduction and long finger flexion.  There is dysesthesia present and predominantly at C6 distribution.  There is a negative Tinel's test at the bilateral wrist and elbow. There is a negative Phalen's test bilaterally. There is a negative Hoffmann's test bilaterally.  Neurological: He is alert and oriented to person, place, and time. He exhibits normal muscle tone. Coordination normal.  Skin: Skin is warm and dry. No rash noted. No erythema.    Ortho Exam Imaging: No results found.  Past Medical/Family/Surgical/Social History: Medications & Allergies reviewed per EMR, new medications updated. There are no active problems to display for this patient.  Past Medical History:  Diagnosis Date  . Acid reflux   . Sciatica    Family History  Adopted: Yes   Past Surgical History:  Procedure Laterality Date  . TONSILLECTOMY  1985   Social History   Occupational History  . Not on file  Tobacco Use  . Smoking status: Former Games developer  . Smokeless tobacco: Never Used  Substance and Sexual Activity  . Alcohol use: Yes    Alcohol/week: 8.0 standard drinks    Types: 8 Shots of liquor per week    Comment: social drinking on weekend  . Drug use: Yes    Types: Marijuana  . Sexual activity: Not Currently

## 2018-07-18 ENCOUNTER — Encounter (INDEPENDENT_AMBULATORY_CARE_PROVIDER_SITE_OTHER): Payer: Self-pay | Admitting: Orthopaedic Surgery

## 2018-07-18 ENCOUNTER — Ambulatory Visit (INDEPENDENT_AMBULATORY_CARE_PROVIDER_SITE_OTHER): Payer: BLUE CROSS/BLUE SHIELD | Admitting: Orthopaedic Surgery

## 2018-07-18 VITALS — BP 144/96 | HR 94 | Ht 71.0 in | Wt 245.0 lb

## 2018-07-18 DIAGNOSIS — M4802 Spinal stenosis, cervical region: Secondary | ICD-10-CM

## 2018-07-18 DIAGNOSIS — M5020 Other cervical disc displacement, unspecified cervical region: Secondary | ICD-10-CM | POA: Diagnosis not present

## 2018-07-18 DIAGNOSIS — G5602 Carpal tunnel syndrome, left upper limb: Secondary | ICD-10-CM | POA: Diagnosis not present

## 2018-07-18 DIAGNOSIS — M5030 Other cervical disc degeneration, unspecified cervical region: Secondary | ICD-10-CM

## 2018-07-18 MED ORDER — HYDROCODONE-ACETAMINOPHEN 5-325 MG PO TABS: 1.0000 | ORAL_TABLET | Freq: Four times a day (QID) | ORAL | 0 refills | Status: DC | PRN

## 2018-07-18 NOTE — Progress Notes (Addendum)
Office Visit Note   Patient: Paul Huff           Date of Birth: 11-20-1970           MRN: [id number] Visit Date: 07/18/2018              Requested by: Shirline Frees, NP [address] [place], [place] [address] PCP: Shirline Frees, NP   Assessment & Plan: Visit Diagnoses:  1. Spinal stenosis of cervical region   2. Other cervical disc degeneration, unspecified cervical region   3. Carpal tunnel syndrome, left upper limb   4. Cervical disc herniation            At C3-4 with cord abnormal signal  Plan: Patient has large C3-4 cervical disc herniation left paracentral with cord compression and abnormal cord signal.  Normally is single level cervical fusion would be recommended however he has additional disc protrusions at other levels at C4-5, C5-6 and C6-7 with mild to moderate stenosis at these levels with some cord flattening.  He is having bilateral upper extremity weakness worse on the left than right and also additionally has some mild carpal tunnel syndrome symptoms.  We discussed that some of his left hand numbness may be related to the damage down to the cord from the large disc herniation.  With his multilevel other changes recommendation would be single level cervical disc arthroplasty at C3-4 with removal of the large disc herniation using operative microscope.  He understands that with time he may get progression at other levels and may require surgery at those other levels in the future.  He has numbness with his left hand with typing and I recommend proceeding with carpal tunnel procedure under the same anesthetic after the cervical disc arthroplasty is done since is difficult to determine whether the hand numbness symptoms are related to the severe cord compression at C3-4 or to his carpal tunnel syndrome with prolongation of distal peak latency at the median nerve.  We discussed he be out of work for a period of at least 4 weeks after the disc arthroplasty.   Overnight stay in the hospital use of a drain.  He understands that some of the changes in his cord may be permanent and the surgery is to prevent progression of the cord damage.  Fortunately he is not having significant myelopathic symptoms at this time.  We discussed general anesthesia, overnight stay use of a Hemovac drain that remove the following morning.  Questions were elicited and answered.  Patient understands and requests we proceed.  He states he had already discussed with Dr. Otelia Sergeant the disc arthroplasty and has a good understanding of what the procedure entails.  We discussed care and limiting his activities until the surgery to prevent further damage to the cord.  Follow-Up Instructions: No follow-ups on file.   Orders:  No orders of the defined types were placed in this encounter.  Meds ordered this encounter  Medications  . HYDROcodone-acetaminophen (NORCO/VICODIN) 5-325 MG tablet    Sig: Take 1 tablet by mouth every 6 (six) hours as needed for moderate pain.    Dispense:  30 tablet    Refill:  0      Procedures: No procedures performed   Clinical Data: No additional findings.   Subjective: Chief Complaint  Patient presents with  . Right Hand - Follow-up    NCS review  . Left Hand - Follow-up    NCS Review    HPI 47 year old male  with chronic neck pain and left greater than right arm pain with numbness.  Patient has multilevel disc degeneration in the cervical spine with the C3-4 large central disc herniation with cord changes and was sent to me by Dr. James Nitka for C3-4 cervical disc arthroplasty.  He has positive EMGs nerve conduction velocities consistent with some left carpal tunnel syndrome.  He drops objects and is used night splints without relief.  Patient has cervical stenosis at C3-4 with narrowing of the thecal sac down to 6 mm with T2 cord signal changes of the cervical spine at C3-4.  Additionally has some left paracentral disc protrusions without severe  stenosis in its rated at mild to moderate at C4-5, C5-6 and C6-7.  He has had significant increased pain with upper gazing.  Difficulty lifting his left arm.  He has not had any falling episodes and still is been able to walk.  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  Pain in his neck wakes him up at night and he has to shake his left hand.  Currently is working for [employer]. 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  Review of Systems 14 point review of systems updated and is unchanged from 06/07/2018 office visit with Dr. Nitka.  Of note his neck pain neck stiffness cervical stenosis, left carpal tunnel syndrome.  History of back pain with back stiffness.   Objective: Vital Signs: BP (!) 144/96   Pulse 94   Ht 5\' 11"  (1.803 m)   Wt 245 lb (111.1 kg)   BMI 34.17 kg/m   Physical Exam  Constitutional: He is oriented to person, place, and time. He appears well-developed and well-nourished.  HENT:  Head: Normocephalic and atraumatic.  Eyes: Pupils are equal, round, and reactive to light. EOM are normal.  Neck: No tracheal deviation present. No thyromegaly present.  Cardiovascular: Normal rate.  Pulmonary/Chest: Effort normal. He has no wheezes.  Abdominal: Soft. Bowel sounds are normal.  Neurological: He is alert and oriented to person, place, and time.  Skin: Skin is warm and dry. Capillary refill takes less than 2 seconds.  Psychiatric: He has a normal mood and affect. His behavior is normal. Judgment and thought content normal.    Ortho Exam patient has 30% neck extension with sharp pain.  Forward flexion chin 4 fingerbreadths to the chest with positive Lhermitte.  Bilateral brachial plexus tenderness both right and left.  No lower extremity clonus no lower extremity hyperreflexia.  Quads anterior tib gastrocsoleus is strong.  Specialty Comments:  No specialty comments  available.  Imaging:CLINICAL DATA:  Initial evaluation for left-sided neck pain radiating into the left upper extremity for 36 hours.  EXAM: MRI CERVICAL SPINE WITHOUT AND WITH CONTRAST  TECHNIQUE: Multiplanar and multiecho pulse sequences of the cervical spine, to include the craniocervical junction and cervicothoracic junction, were obtained without and with intravenous contrast.  CONTRAST:  30Renaldo FSt [facility] Felicity CoRupert StSedalia Surgery Center61[facility] HosFelicity RaLPh [facility]93moyFRenaldo FChristus Good Shepherd Medical Center - MarFelicity CoRupert StAlbany Urology Surgery Center LLC Dba [facility]62mckGRenaldo FTotal Back Care CenteFelicity CoRupert StGadsden Regional Medical Center4mcRenaldo FThe Endoscopy Center Of TexaFelicity CoRupert StHealth Central51mcRenaldo FSummit Oaks HosFelicity CoRupert StNaval Health Clinic Cherry Point6Renaldo FMerwick Rehabilitation Hospital And Nursing Care CFelicity CoRupert StNorthwest Ohio Psychiatric Hospital80mckRenaldo FDimensions Surgery CFelicity CoRupert StBhc Fairfax Hospital106mckBurRenaldo FFort Worth Endoscopy CFelicity CoRupert StTexas Precision Surgery Center LLCRenaldo FMetro Health Medical CFelicity CoRupert StSutter Auburn Surgery Center19mRenaldo FSmokey Point Behaivoral HosFelicity CoRupert StWestside Gi CenterRenaldo FCentral Valley Specialty HosFelicity CoRupert StWellbridge Hospital Of Plano13Renaldo FCoshocton County Memorial HosFelicity CoRupert StCookeville Regional Medical CenteracksrrSJairo BenADOBENATE DIMEGLUMINE 529 MG/ML IV SOLN  COMPARISON:  None.  FINDINGS: Alignment: Straightening of the normal cervical lordosis. No listhesis.  Vertebrae: Vertebral body heights maintained without evidence for acute or chronic fracture. Bone marrow signal intensity within normal limits. No discrete or worrisome osseous lesions. No abnormal marrow edema or enhancement.  Cord: Suspected subtle T2 cord signal abnormality within the cervical spinal cord at the level of C3-4 related to stenosis (series 4, image 7). Signal intensity within the cervical  spinal cord is otherwise normal.  Posterior Fossa, vertebral arteries, paraspinal tissues: Visualized brain and posterior fossa within normal limits. Craniocervical junction normal. Paraspinous and prevertebral soft tissues demonstrate no acute finding. Normal intravascular flow voids present within the vertebral arteries bilaterally.  Disc levels:  C2-C3: Unremarkable.  C3-C4: Broad left paracentral disc protrusion indents and effaces the central and left ventral thecal sac (series 6, image 17). Slight cephalad and caudad migration of disc material. Protruding disc flattens the cervical spinal cord with probable subtle cord signal abnormality. Moderate to severe spinal stenosis with the thecal sac measuring 6 mm in AP diameter at its most narrow point. Protruding disc extends laterally into the left neural foramen with associated severe left C4 foraminal stenosis. Superimposed uncovertebral spurring with  moderate right foraminal narrowing as well.  C4-C5: Small left paracentral disc protrusion indents the left ventral thecal sac. Mild flattening of the left hemi cord without cord signal changes. Mild spinal stenosis. Foramina remain patent.  C5-C6: Left paracentral disc protrusion indents the left ventral thecal sac. Flattening of the left hemi cord without cord signal changes. Mild to moderate left-sided spinal stenosis. Foramina remain patent.  C6-C7: Broad left paracentral disc protrusion indents the central and left ventral thecal sac. Flattening of the left hemi cord without cord signal changes. Moderate left-sided spinal stenosis. Mild bilateral C7 foraminal stenosis.  C7-T1: Mild facet hypertrophy on the right. No significant stenosis.  Visualized upper thoracic spine demonstrates no significant finding.  IMPRESSION: 1. Left paracentral disc protrusion at C3-4 with lateral extension into the left neural foramen, with resultant moderate to severe canal and severe left C4 foraminal stenosis. Subtle cord signal abnormality at this level suspicious for mild edema and/or myelomalacia. 2. Additional left paracentral disc protrusions at C4-5, C5-6, and C6-7 with resultant mild to moderate spinal stenosis as above. Secondary cord flattening at these levels without cord signal changes.   Electronically Signed   By: Rise Mu M.D.   On: 03/31/2018 19:14    PMFS History: Patient Active Problem List   Diagnosis Date Noted  . Spinal stenosis of cervical region 07/21/2018   Past Medical History:  Diagnosis Date  . Acid reflux   . Sciatica     Family History  Adopted: Yes    Past Surgical History:  Procedure Laterality Date  . TONSILLECTOMY  1985   Social History   Occupational History  . Not on file  Tobacco Use  . Smoking status: Former Games developer  . Smokeless tobacco: Never Used  Substance and Sexual Activity  . Alcohol use: Yes     Alcohol/week: 8.0 standard drinks    Types: 8 Shots of liquor per week    Comment: social drinking on weekend  . Drug use: Yes    Types: Marijuana  . Sexual activity: Not Currently

## 2018-07-21 ENCOUNTER — Encounter (INDEPENDENT_AMBULATORY_CARE_PROVIDER_SITE_OTHER): Payer: Self-pay | Admitting: Orthopaedic Surgery

## 2018-07-21 DIAGNOSIS — M5030 Other cervical disc degeneration, unspecified cervical region: Secondary | ICD-10-CM | POA: Insufficient documentation

## 2018-07-21 DIAGNOSIS — M5020 Other cervical disc displacement, unspecified cervical region: Secondary | ICD-10-CM | POA: Insufficient documentation

## 2018-07-21 DIAGNOSIS — G5602 Carpal tunnel syndrome, left upper limb: Secondary | ICD-10-CM | POA: Insufficient documentation

## 2018-07-21 DIAGNOSIS — M4802 Spinal stenosis, cervical region: Secondary | ICD-10-CM | POA: Insufficient documentation

## 2018-07-25 ENCOUNTER — Telehealth: Payer: Self-pay | Admitting: Family Medicine

## 2018-07-25 NOTE — Telephone Encounter (Signed)
Received medical clearance form from [facility] requesting medical clearance for surgery.  Pt needs appointment.  Left a message for a return call on home phone.

## 2018-07-25 NOTE — Telephone Encounter (Signed)
Pt is scheduled for 07/26/18.  No further action required.

## 2018-07-26 ENCOUNTER — Ambulatory Visit (INDEPENDENT_AMBULATORY_CARE_PROVIDER_SITE_OTHER): Payer: BLUE CROSS/BLUE SHIELD | Admitting: Surgery

## 2018-07-26 ENCOUNTER — Ambulatory Visit (INDEPENDENT_AMBULATORY_CARE_PROVIDER_SITE_OTHER): Payer: BLUE CROSS/BLUE SHIELD | Admitting: Adult Health

## 2018-07-26 ENCOUNTER — Ambulatory Visit (INDEPENDENT_AMBULATORY_CARE_PROVIDER_SITE_OTHER): Payer: BLUE CROSS/BLUE SHIELD

## 2018-07-26 ENCOUNTER — Encounter: Payer: Self-pay | Admitting: Adult Health

## 2018-07-26 ENCOUNTER — Encounter (INDEPENDENT_AMBULATORY_CARE_PROVIDER_SITE_OTHER): Payer: Self-pay | Admitting: Surgery

## 2018-07-26 VITALS — BP 132/85 | HR 81 | Ht 71.0 in | Wt 245.0 lb

## 2018-07-26 VITALS — BP 114/82 | Temp 98.3°F | Wt 251.0 lb

## 2018-07-26 DIAGNOSIS — G5602 Carpal tunnel syndrome, left upper limb: Secondary | ICD-10-CM

## 2018-07-26 DIAGNOSIS — Z01818 Encounter for other preprocedural examination: Secondary | ICD-10-CM | POA: Diagnosis not present

## 2018-07-26 DIAGNOSIS — M5010 Cervical disc disorder with radiculopathy, unspecified cervical region: Secondary | ICD-10-CM

## 2018-07-26 LAB — CBC WITH DIFFERENTIAL/PLATELET
BASOS PCT: 0.9 % (ref 0.0–3.0)
Basophils Absolute: 0.1 10*3/uL (ref 0.0–0.1)
Eosinophils Absolute: 0.3 10*3/uL (ref 0.0–0.7)
Eosinophils Relative: 3 % (ref 0.0–5.0)
HCT: 45.7 % (ref 39.0–52.0)
Hemoglobin: 15.5 g/dL (ref 13.0–17.0)
LYMPHS ABS: 3.3 10*3/uL (ref 0.7–4.0)
Lymphocytes Relative: 32.2 % (ref 12.0–46.0)
MCHC: 34 g/dL (ref 30.0–36.0)
MCV: 84.7 fl (ref 78.0–100.0)
MONO ABS: 1 10*3/uL (ref 0.1–1.0)
Monocytes Relative: 10 % (ref 3.0–12.0)
NEUTROS ABS: 5.5 10*3/uL (ref 1.4–7.7)
NEUTROS PCT: 53.9 % (ref 43.0–77.0)
PLATELETS: 311 10*3/uL (ref 150.0–400.0)
RBC: 5.39 Mil/uL (ref 4.22–5.81)
RDW: 14 % (ref 11.5–15.5)
WBC: 10.3 10*3/uL (ref 4.0–10.5)

## 2018-07-26 LAB — BASIC METABOLIC PANEL
BUN: 8 mg/dL (ref 6–23)
CALCIUM: 9.5 mg/dL (ref 8.4–10.5)
CO2: 27 mEq/L (ref 19–32)
Chloride: 104 mEq/L (ref 96–112)
Creatinine, Ser: 0.98 mg/dL (ref 0.40–1.50)
GFR: 86.97 mL/min (ref >60.00)
Glucose, Bld: 87 mg/dL (ref 70–99)
Potassium: 4.6 mEq/L (ref 3.5–5.1)
SODIUM: 138 meq/L (ref 135–145)

## 2018-07-26 LAB — PROTIME-INR
INR: 1.1 ratio — AB (ref 0.8–1.0)
Prothrombin Time: 12.6 s (ref 9.6–13.1)

## 2018-07-26 NOTE — Progress Notes (Signed)
47 year old C3-4 HNP/stenosis and left carpal tunnel syndrome resents for preop evaluation.  Symptoms unchanged from previous visit.  He is wanting to proceed with C3-4 ACDF and left carpal tunnel release as scheduled.  Today full history and physical performed.  Advised patient that I anticipate that he will be out of work at least 6 to 12 weeks postop.  He will discuss this with his job.

## 2018-07-26 NOTE — Progress Notes (Signed)
Subjective:    Patient ID: Paul Huff, male    DOB: [birth year], 47 y.o.   MRN: [id number]  HPI 47 year old male who  has a past medical history of Acid reflux and Sciatica.  He presents to the office today for pre surgical clearance. He will be having a Left carpal tunnel release and C3-C4 Disk Arthroplasty. Dr Ophelia Charter will be the surgeon.   He has no questions about the surgery and these have been answered previously and he is in good spirits about getting pain relief.    Review of Systems See HPI   Past Medical History:  Diagnosis Date  . Acid reflux   . Sciatica     Social History   Socioeconomic History  . Marital status: Married    Spouse name: Not on file  . Number of children: Not on file  . Years of education: Not on file  . Highest education level: Not on file  Occupational History  . Not on file  Social Needs  . Financial resource strain: Not on file  . Food insecurity:    Worry: Not on file    Inability: Not on file  . Transportation needs:    Medical: Not on file    Non-medical: Not on file  Tobacco Use  . Smoking status: Former Games developer  . Smokeless tobacco: Never Used  Substance and Sexual Activity  . Alcohol use: Yes    Alcohol/week: 8.0 standard drinks    Types: 8 Shots of liquor per week    Comment: social drinking on weekend  . Drug use: Yes    Types: Marijuana  . Sexual activity: Not Currently  Lifestyle  . Physical activity:    Days per week: Not on file    Minutes per session: Not on file  . Stress: Not on file  Relationships  . Social connections:    Talks on phone: Not on file    Gets together: Not on file    Attends religious service: Not on file    Active member of club or organization: Not on file    Attends meetings of clubs or organizations: Not on file    Relationship status: Not on file  . Intimate partner violence:    Fear of current or ex partner: Not on file    Emotionally abused: Not on file    Physically  abused: Not on file    Forced sexual activity: Not on file  Other Topics Concern  . Not on file  Social History Narrative   Administrator, Civil Service    Married    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     Past Surgical History:  Procedure Laterality Date  . TONSILLECTOMY  1985    Family History  Adopted: Yes    No Known Allergies  Current Outpatient Medications on File Prior to Visit  Medication Sig Dispense Refill  . HYDROcodone-acetaminophen (NORCO/VICODIN) 5-325 MG tablet Take 1 tablet by mouth every 6 (six) hours as needed for moderate pain. 30 tablet 0   No current facility-administered medications on file prior to visit.     BP 114/82   Temp 98.3 F (36.8 C)   Wt 251 lb (113.9 kg)   BMI 35.01 kg/m       Objective:   Physical Exam  Constitutional: He is oriented to person, place, and time. He appears well-developed and well-nourished. No distress.  HENT:  Head: Normocephalic and atraumatic.  Right Ear: External ear normal.  Left Ear: External ear normal.  Nose: Nose normal.  Mouth/Throat: Oropharynx is clear and moist. No oropharyngeal exudate.  Eyes: Pupils are equal, round, and reactive to light. Conjunctivae and EOM are normal.  Neck: Normal range of motion. Neck supple.  Cardiovascular: Normal rate, regular rhythm, normal heart sounds and intact distal pulses. Exam reveals no gallop and no friction rub.  No murmur heard. Pulmonary/Chest: Effort normal and breath sounds normal. No stridor. No respiratory distress. He has no wheezes. He has no rales.  Abdominal: Soft. Bowel sounds are normal. He exhibits no distension. There is no tenderness. There is no rebound and no guarding. No hernia.  Musculoskeletal: Normal range of motion. He exhibits no edema, tenderness or deformity.  Neurological: He is alert and oriented to person, place, and time. He displays normal reflexes. No cranial nerve deficit or sensory deficit. He exhibits  normal muscle tone. Coordination normal.  Skin: Skin is warm and dry. Capillary refill takes less than 2 seconds. He is not diaphoretic.  Psychiatric: He has a normal mood and affect. His behavior is normal. Judgment and thought content normal.  Nursing note and vitals reviewed.      Assessment & Plan:  1. Pre-operative clearance - EKG 12-Lead- NSR, Rate 68 - CBC with Differential/Platelet - Basic Metabolic Panel - Protime-INR - DG Chest 2 View; Future   Shirline Frees, NP

## 2018-07-26 NOTE — Pre-Procedure Instructions (Signed)
Paul Huff  07/26/2018      [id number] - [place], [place] - [address] AT [facility] [address] [place] [address] Phone: [phone] Fax: [phone]    Your procedure is scheduled on Wednesday November 6.  Report to [facility] Admitting at 10:30 A.M.  Call this number if you have problems the morning of surgery:  [phone]   Remember:  Do not eat or drink after midnight.    Take these medicines the morning of surgery with A SIP OF WATER:   Gabapentin (neurontin) if needed Hydrocodone-acetaminophen (Norco) if needed  7 days prior to surgery STOP taking any Aspirin(unless otherwise instructed by your surgeon), Aleve, Naproxen, Ibuprofen, Motrin, Advil, Goody's, BC's, all herbal medications, fish oil, and all vitamins     Do not wear jewelry  Do not wear lotions, powders, or colognes, or deodorant.  Do not shave 48 hours prior to surgery.  Men may shave face and neck.  Do not bring valuables to the hospital.  [facility] is not responsible for any belongings or valuables.  Contacts, dentures or bridgework may not be worn into surgery.  Leave your suitcase in the car.  After surgery it may be brought to your room.  For patients admitted to the hospital, discharge time will be determined by your treatment team.  Patients discharged the day of surgery will not be allowed to drive home.   Special instructions:    [facility]- Preparing For Surgery  Before surgery, you can play an important role. Because skin is not sterile, your skin needs to be as free of germs as possible. You can reduce the number of germs on your skin by washing with CHG (chlorahexidine gluconate) Soap before surgery.  CHG is an antiseptic cleaner which kills germs and bonds with the skin to continue killing germs even after washing.    Oral Hygiene is also important to reduce your risk of infection.  Remember - BRUSH  YOUR TEETH THE MORNING OF SURGERY WITH YOUR REGULAR TOOTHPASTE  Please do not use if you have an allergy to CHG or antibacterial soaps. If your skin becomes reddened/irritated stop using the CHG.  Do not shave (including legs and underarms) for at least 48 hours prior to first CHG shower. It is OK to shave your face.  Please follow these instructions carefully.   1. Shower the NIGHT BEFORE SURGERY and the MORNING OF SURGERY with CHG.   2. If you chose to wash your hair, wash your hair first as usual with your normal shampoo.  3. After you shampoo, rinse your hair and body thoroughly to remove the shampoo.  4. Use CHG as you would any other liquid soap. You can apply CHG directly to the skin and wash gently with a scrungie or a clean washcloth.   5. Apply the CHG Soap to your body ONLY FROM THE NECK DOWN.  Do not use on open wounds or open sores. Avoid contact with your eyes, ears, mouth and genitals (private parts). Wash Face and genitals (private parts)  with your normal soap.  6. Wash thoroughly, paying special attention to the area where your surgery will be performed.  7. Thoroughly rinse your body with warm water from the neck down.  8. DO NOT shower/wash with your normal soap after using and rinsing off the CHG Soap.  9. Pat yourself dry with a CLEAN TOWEL.  10. Wear CLEAN PAJAMAS to  bed the night before surgery, wear comfortable clothes the morning of surgery  11. Place CLEAN SHEETS on your bed the night of your first shower and DO NOT SLEEP WITH PETS.    Day of Surgery:  Do not apply any deodorants/lotions.  Please wear clean clothes to the hospital/surgery center.   Remember to brush your teeth WITH YOUR REGULAR TOOTHPASTE.    Please read over the following fact sheets that you were given. Coughing and Deep Breathing, MRSA Information and Surgical Site Infection Prevention

## 2018-07-27 ENCOUNTER — Other Ambulatory Visit: Payer: Self-pay

## 2018-07-27 ENCOUNTER — Encounter: Payer: Self-pay | Admitting: Family Medicine

## 2018-07-27 ENCOUNTER — Encounter (HOSPITAL_COMMUNITY): Payer: Self-pay

## 2018-07-27 ENCOUNTER — Encounter (HOSPITAL_COMMUNITY)
Admission: RE | Admit: 2018-07-27 | Discharge: 2018-07-27 | Disposition: A | Discharge status: Home / Self Care | Payer: BLUE CROSS/BLUE SHIELD | Source: Ambulatory Visit | Attending: Orthopaedic Surgery | Admitting: Orthopaedic Surgery

## 2018-07-27 DIAGNOSIS — Z01812 Encounter for preprocedural laboratory examination: Secondary | ICD-10-CM | POA: Diagnosis present

## 2018-07-27 LAB — COMPREHENSIVE METABOLIC PANEL
ALK PHOS: 52 U/L (ref 38–126)
ALT: 21 U/L (ref 0–44)
ANION GAP: 11 (ref 5–15)
AST: 22 U/L (ref 15–41)
Albumin: 3.9 g/dL (ref 3.5–5.0)
BUN: 7 mg/dL (ref 6–20)
CALCIUM: 9.1 mg/dL (ref 8.9–10.3)
CHLORIDE: 104 mmol/L (ref 98–111)
CO2: 24 mmol/L (ref 22–32)
CREATININE: 1.03 mg/dL (ref 0.61–1.24)
GFR calc non Af Amer: >60 mL/min (ref >60)
Glucose, Bld: 114 mg/dL — ABNORMAL HIGH (ref 70–99)
Potassium: 4 mmol/L (ref 3.5–5.1)
Sodium: 139 mmol/L (ref 135–145)
Total Bilirubin: 0.7 mg/dL (ref 0.3–1.2)
Total Protein: 6.5 g/dL (ref 6.5–8.1)

## 2018-07-27 LAB — SURGICAL PCR SCREEN
MRSA, PCR: NEGATIVE
STAPHYLOCOCCUS AUREUS: NEGATIVE

## 2018-07-27 NOTE — Anesthesia Preprocedure Evaluation (Addendum)
Anesthesia Evaluation  Patient identified by MRN, date of birth, ID band Patient awake    Reviewed: Allergy & Precautions, H&P , NPO status , Patient's Chart, lab work & pertinent test results  Airway Mallampati: II  TM Distance: >3 FB Neck ROM: Full    Dental no notable dental hx. (+) Chipped, Dental Advisory Given   Pulmonary neg pulmonary ROS, former smoker,    Pulmonary exam normal breath sounds clear to auscultation       Cardiovascular negative cardio ROS   Rhythm:Regular Rate:Normal     Neuro/Psych negative neurological ROS  negative psych ROS   GI/Hepatic Neg liver ROS, GERD  Controlled and Medicated,  Endo/Other  negative endocrine ROS  Renal/GU negative Renal ROS  negative genitourinary   Musculoskeletal  (+) Arthritis , Osteoarthritis,    Abdominal   Peds  Hematology negative hematology ROS (+)   Anesthesia Other Findings   Reproductive/Obstetrics negative OB ROS                           Anesthesia Physical Anesthesia Plan  ASA: II  Anesthesia Plan: General   Post-op Pain Management:    Induction: Intravenous  PONV Risk Score and Plan: 3 and Ondansetron, Dexamethasone and Midazolam  Airway Management Planned: Oral ETT  Additional Equipment:   Intra-op Plan:   Post-operative Plan: Extubation in OR  Informed Consent: I have reviewed the patients History and Physical, chart, labs and discussed the procedure including the risks, benefits and alternatives for the proposed anesthesia with the patient or authorized representative who has indicated his/her understanding and acceptance.   Dental advisory given  Plan Discussed with: CRNA  Anesthesia Plan Comments: (See PAT note 07/27/2018 by Antionette Poles, PA-C )       Anesthesia Quick Evaluation

## 2018-07-27 NOTE — Progress Notes (Signed)
PCP - Shirline Frees, PA-C Cardiologist - denies  Chest x-ray -  07/26/18 EKG - 07/26/18 Stress Test -  Patient was scheduled for Stress test in September 2018 but canceled appointment and never followed up. Denies current cardiac issues. ECHO - denies Cardiac Cath - denies  Sleep Study - patient has never had sleep study  Aspirin Instructions: patient instructed to stop all ASA/NSAIDS 7 days prior to surgery. Patient educated on use of marijuana and to stop at least 24 hours prior to surgery. Patient states last use of marijuana over 1 week ago.  Anesthesia review: yes- stress test ordered but not done.  Patient denies shortness of breath, fever, cough and chest pain at PAT appointment   Patient verbalized understanding of instructions that were given to them at the PAT appointment. Patient was also instructed that they will need to review over the PAT instructions again at home before surgery.

## 2018-07-27 NOTE — Progress Notes (Signed)
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 Pertinent hx includes GERD and sciatica.  Last year pt's PCP Shirline Frees, NP ordered a stress echo to eval pt's intermittent chest pain. Evidently this was cancelled and the pt does not report any current CV symptoms. Pt was cleared for surgery by Shirline Frees 07/26/2018.  Anticipate he can proceed as planned barring acute status change.  VS: BP (!) 141/88   Pulse 81   Temp 36.8 C   Resp 20   Ht 5\' 11"  (1.803 m)   Wt 113.7 kg   SpO2 99%   BMI 34.97 kg/m   PROVIDERS: Shirline Frees, NP is PCP   LABS: Labs reviewed: Acceptable for surgery. (all labs ordered are listed, but only abnormal results are displayed)  Labs Reviewed  COMPREHENSIVE METABOLIC PANEL - Abnormal; Notable for the following components:      Result Value   Glucose, Bld 114 (*)    All other components within normal limits  SURGICAL PCR SCREEN     IMAGES: CHEST - 2 VIEW 07/26/2018  COMPARISON:  Chest x-ray of 04/09/2017  FINDINGS: No active infiltrate or effusion is seen. Mediastinal and hilar contours contours are unremarkable other than peribronchial thickening which may indicate chronic bronchitis in this patient with a smoking history. The heart is within normal limits in size. No bony abnormality is seen.  IMPRESSION: No pneumonia or pleural effusion. Peribronchial thickening may indicate chronic bronchitis in this patient with a smoking history.   EKG: 07/26/2018: NSR. Rate 68.  CV: N/A  Past Medical History:  Diagnosis Date  . Acid reflux   . Sciatica     Past Surgical History:  Procedure  Laterality Date  . TONSILLECTOMY  1985    MEDICATIONS: . HYDROcodone-acetaminophen (NORCO/VICODIN) 5-325 MG tablet   No current facility-administered medications for this encounter.      Zannie Cove [facility] Short Stay Center/Anesthesiology Phone [phone] 07/27/2018 2:42 PM

## 2018-08-02 ENCOUNTER — Encounter (HOSPITAL_COMMUNITY): Payer: Self-pay | Admitting: *Deleted

## 2018-08-02 ENCOUNTER — Ambulatory Visit (HOSPITAL_COMMUNITY): Payer: BLUE CROSS/BLUE SHIELD | Admitting: Certified Registered"

## 2018-08-02 ENCOUNTER — Encounter (HOSPITAL_COMMUNITY)
Admission: RE | Disposition: A | Discharge status: Home / Self Care | Payer: Self-pay | Source: Ambulatory Visit | Attending: Orthopaedic Surgery

## 2018-08-02 ENCOUNTER — Ambulatory Visit (HOSPITAL_COMMUNITY): Payer: BLUE CROSS/BLUE SHIELD

## 2018-08-02 ENCOUNTER — Other Ambulatory Visit: Payer: Self-pay

## 2018-08-02 ENCOUNTER — Observation Stay (HOSPITAL_COMMUNITY)
Admission: RE | Admit: 2018-08-02 | Discharge: 2018-08-03 | Disposition: A | Discharge status: Home / Self Care | Payer: BLUE CROSS/BLUE SHIELD | Source: Ambulatory Visit | Attending: Orthopaedic Surgery | Admitting: Orthopaedic Surgery

## 2018-08-02 ENCOUNTER — Ambulatory Visit (HOSPITAL_COMMUNITY): Payer: BLUE CROSS/BLUE SHIELD | Admitting: Physician Assistant

## 2018-08-02 DIAGNOSIS — M5020 Other cervical disc displacement, unspecified cervical region: Secondary | ICD-10-CM | POA: Diagnosis not present

## 2018-08-02 DIAGNOSIS — G5602 Carpal tunnel syndrome, left upper limb: Secondary | ICD-10-CM

## 2018-08-02 DIAGNOSIS — M4802 Spinal stenosis, cervical region: Secondary | ICD-10-CM | POA: Diagnosis present

## 2018-08-02 DIAGNOSIS — Z419 Encounter for procedure for purposes other than remedying health state, unspecified: Secondary | ICD-10-CM

## 2018-08-02 DIAGNOSIS — G5603 Carpal tunnel syndrome, bilateral upper limbs: Secondary | ICD-10-CM | POA: Diagnosis not present

## 2018-08-02 DIAGNOSIS — M5021 Other cervical disc displacement,  high cervical region: Secondary | ICD-10-CM | POA: Insufficient documentation

## 2018-08-02 DIAGNOSIS — Z87891 Personal history of nicotine dependence: Secondary | ICD-10-CM | POA: Diagnosis not present

## 2018-08-02 HISTORY — PX: CERVICAL DISC ARTHROPLASTY: SHX587

## 2018-08-02 HISTORY — PX: CARPAL TUNNEL RELEASE: SHX101

## 2018-08-02 SURGERY — CERVICAL ANTERIOR DISC ARTHROPLASTY
Anesthesia: General | Site: Wrist

## 2018-08-02 MED ORDER — GLYCOPYRROLATE PF 0.2 MG/ML IJ SOSY
PREFILLED_SYRINGE | INTRAMUSCULAR | Status: DC | PRN
  Administered 2018-08-02: .2 mg via INTRAVENOUS

## 2018-08-02 MED ORDER — SODIUM CHLORIDE 0.9% FLUSH: 3.0000 mL | INTRAVENOUS | Status: DC | PRN

## 2018-08-02 MED ORDER — MIDAZOLAM HCL 2 MG/2ML IJ SOLN
INTRAMUSCULAR | Status: AC
  Filled 2018-08-02: qty 2

## 2018-08-02 MED ORDER — DOCUSATE SODIUM 100 MG PO CAPS
100.0000 mg | ORAL_CAPSULE | Freq: Two times a day (BID) | ORAL | Status: DC
  Administered 2018-08-02 – 2018-08-03 (×2): 100 mg via ORAL
  Filled 2018-08-02 (×2): qty 1

## 2018-08-02 MED ORDER — 0.9 % SODIUM CHLORIDE (POUR BTL) OPTIME
TOPICAL | Status: DC | PRN
  Administered 2018-08-02: 1000 mL

## 2018-08-02 MED ORDER — CEFAZOLIN SODIUM-DEXTROSE 1-4 GM/50ML-% IV SOLN
1.0000 g | Freq: Three times a day (TID) | INTRAVENOUS | Status: AC
  Administered 2018-08-02 – 2018-08-03 (×2): 1 g via INTRAVENOUS
  Filled 2018-08-02 (×2): qty 50

## 2018-08-02 MED ORDER — METHOCARBAMOL 500 MG PO TABS
500.0000 mg | ORAL_TABLET | Freq: Four times a day (QID) | ORAL | Status: DC | PRN
  Administered 2018-08-02 – 2018-08-03 (×3): 500 mg via ORAL
  Filled 2018-08-02 (×2): qty 1

## 2018-08-02 MED ORDER — ACETAMINOPHEN 325 MG PO TABS
650.0000 mg | ORAL_TABLET | ORAL | Status: DC | PRN
  Administered 2018-08-02 – 2018-08-03 (×2): 650 mg via ORAL
  Filled 2018-08-02 (×2): qty 2

## 2018-08-02 MED ORDER — SODIUM CHLORIDE 0.9% FLUSH: 3.0000 mL | Freq: Two times a day (BID) | INTRAVENOUS | Status: DC

## 2018-08-02 MED ORDER — FENTANYL CITRATE (PF) 250 MCG/5ML IJ SOLN
INTRAMUSCULAR | Status: AC
  Filled 2018-08-02: qty 5

## 2018-08-02 MED ORDER — ONDANSETRON HCL 4 MG PO TABS: 4.0000 mg | ORAL_TABLET | Freq: Four times a day (QID) | ORAL | Status: DC | PRN

## 2018-08-02 MED ORDER — HYDROMORPHONE HCL 1 MG/ML IJ SOLN
0.2500 mg | INTRAMUSCULAR | Status: DC | PRN
  Administered 2018-08-02: 0.5 mg via INTRAVENOUS

## 2018-08-02 MED ORDER — OXYCODONE-ACETAMINOPHEN 5-325 MG PO TABS: 1.0000 | ORAL_TABLET | Freq: Four times a day (QID) | ORAL | 0 refills | Status: DC | PRN

## 2018-08-02 MED ORDER — MIDAZOLAM HCL 5 MG/5ML IJ SOLN
INTRAMUSCULAR | Status: DC | PRN
  Administered 2018-08-02: 2 mg via INTRAVENOUS

## 2018-08-02 MED ORDER — OXYCODONE HCL 5 MG PO TABS
ORAL_TABLET | ORAL | Status: AC
  Filled 2018-08-02: qty 2

## 2018-08-02 MED ORDER — FENTANYL CITRATE (PF) 250 MCG/5ML IJ SOLN
INTRAMUSCULAR | Status: DC | PRN
  Administered 2018-08-02 (×5): 50 ug via INTRAVENOUS

## 2018-08-02 MED ORDER — SODIUM CHLORIDE 0.9 % IV SOLN: INTRAVENOUS | Status: DC

## 2018-08-02 MED ORDER — MENTHOL 3 MG MT LOZG: 1.0000 | LOZENGE | OROMUCOSAL | Status: DC | PRN

## 2018-08-02 MED ORDER — ONDANSETRON HCL 4 MG/2ML IJ SOLN
INTRAMUSCULAR | Status: DC | PRN
  Administered 2018-08-02: 4 mg via INTRAVENOUS

## 2018-08-02 MED ORDER — ROCURONIUM BROMIDE 10 MG/ML (PF) SYRINGE
PREFILLED_SYRINGE | INTRAVENOUS | Status: DC | PRN
  Administered 2018-08-02: 50 mg via INTRAVENOUS
  Administered 2018-08-02: 20 mg via INTRAVENOUS
  Administered 2018-08-02 (×2): 10 mg via INTRAVENOUS

## 2018-08-02 MED ORDER — METHOCARBAMOL 500 MG PO TABS: 500.0000 mg | ORAL_TABLET | Freq: Four times a day (QID) | ORAL | 0 refills | Status: DC

## 2018-08-02 MED ORDER — HYDROMORPHONE HCL 1 MG/ML IJ SOLN: 0.5000 mg | INTRAMUSCULAR | Status: DC | PRN

## 2018-08-02 MED ORDER — CEFAZOLIN SODIUM-DEXTROSE 2-4 GM/100ML-% IV SOLN
2.0000 g | INTRAVENOUS | Status: AC
  Administered 2018-08-02: 2 g via INTRAVENOUS

## 2018-08-02 MED ORDER — BUPIVACAINE HCL (PF) 0.25 % IJ SOLN
INTRAMUSCULAR | Status: AC
  Filled 2018-08-02: qty 30

## 2018-08-02 MED ORDER — POLYETHYLENE GLYCOL 3350 17 G PO PACK: 17.0000 g | PACK | Freq: Every day | ORAL | Status: DC

## 2018-08-02 MED ORDER — BUPIVACAINE-EPINEPHRINE 0.5% -1:200000 IJ SOLN
INTRAMUSCULAR | Status: AC
  Filled 2018-08-02: qty 1

## 2018-08-02 MED ORDER — DEXMEDETOMIDINE HCL IN NACL 200 MCG/50ML IV SOLN
INTRAVENOUS | Status: DC | PRN
  Administered 2018-08-02: 8 ug via INTRAVENOUS
  Administered 2018-08-02: 12 ug via INTRAVENOUS
  Administered 2018-08-02: 8 ug via INTRAVENOUS

## 2018-08-02 MED ORDER — SUGAMMADEX SODIUM 200 MG/2ML IV SOLN
INTRAVENOUS | Status: DC | PRN
  Administered 2018-08-02: 200 mg via INTRAVENOUS

## 2018-08-02 MED ORDER — LACTATED RINGERS IV SOLN
INTRAVENOUS | Status: DC
  Administered 2018-08-02 (×2): via INTRAVENOUS

## 2018-08-02 MED ORDER — ARTIFICIAL TEARS OPHTHALMIC OINT
TOPICAL_OINTMENT | OPHTHALMIC | Status: DC | PRN
  Administered 2018-08-02: 1 via OPHTHALMIC

## 2018-08-02 MED ORDER — ONDANSETRON HCL 4 MG/2ML IJ SOLN: 4.0000 mg | Freq: Four times a day (QID) | INTRAMUSCULAR | Status: DC | PRN

## 2018-08-02 MED ORDER — CHLORHEXIDINE GLUCONATE 4 % EX LIQD: 60.0000 mL | Freq: Once | CUTANEOUS | Status: DC

## 2018-08-02 MED ORDER — OXYCODONE HCL 5 MG PO TABS
5.0000 mg | ORAL_TABLET | ORAL | Status: DC | PRN
  Administered 2018-08-02 – 2018-08-03 (×5): 5 mg via ORAL
  Filled 2018-08-02 (×4): qty 1

## 2018-08-02 MED ORDER — LIDOCAINE 2% (20 MG/ML) 5 ML SYRINGE
INTRAMUSCULAR | Status: AC
  Filled 2018-08-02: qty 5

## 2018-08-02 MED ORDER — HEMOSTATIC AGENTS (NO CHARGE) OPTIME
TOPICAL | Status: DC | PRN
  Administered 2018-08-02: 1 via TOPICAL

## 2018-08-02 MED ORDER — PROPOFOL 10 MG/ML IV BOLUS
INTRAVENOUS | Status: AC
  Filled 2018-08-02: qty 20

## 2018-08-02 MED ORDER — PHENOL 1.4 % MT LIQD: 1.0000 | OROMUCOSAL | Status: DC | PRN

## 2018-08-02 MED ORDER — CEFAZOLIN SODIUM-DEXTROSE 2-4 GM/100ML-% IV SOLN
INTRAVENOUS | Status: AC
  Filled 2018-08-02: qty 100

## 2018-08-02 MED ORDER — ACETAMINOPHEN 650 MG RE SUPP: 650.0000 mg | RECTAL | Status: DC | PRN

## 2018-08-02 MED ORDER — HYDROMORPHONE HCL 1 MG/ML IJ SOLN
INTRAMUSCULAR | Status: AC
  Filled 2018-08-02: qty 1

## 2018-08-02 MED ORDER — BUPIVACAINE HCL 0.25 % IJ SOLN
INTRAMUSCULAR | Status: DC | PRN
  Administered 2018-08-02: 5 mL

## 2018-08-02 MED ORDER — DEXAMETHASONE SODIUM PHOSPHATE 10 MG/ML IJ SOLN
INTRAMUSCULAR | Status: DC | PRN
  Administered 2018-08-02: 10 mg via INTRAVENOUS

## 2018-08-02 MED ORDER — PROPOFOL 10 MG/ML IV BOLUS
INTRAVENOUS | Status: DC | PRN
  Administered 2018-08-02: 60 mg via INTRAVENOUS
  Administered 2018-08-02: 140 mg via INTRAVENOUS
  Administered 2018-08-02: 50 mg via INTRAVENOUS

## 2018-08-02 MED ORDER — ONDANSETRON HCL 4 MG/2ML IJ SOLN
INTRAMUSCULAR | Status: AC
  Filled 2018-08-02: qty 6

## 2018-08-02 MED ORDER — THROMBIN (RECOMBINANT) 5000 UNITS EX SOLR
CUTANEOUS | Status: AC
  Filled 2018-08-02: qty 5000

## 2018-08-02 MED ORDER — LIDOCAINE 2% (20 MG/ML) 5 ML SYRINGE
INTRAMUSCULAR | Status: DC | PRN
  Administered 2018-08-02: 100 mg via INTRAVENOUS

## 2018-08-02 MED ORDER — ARTIFICIAL TEARS OPHTHALMIC OINT
TOPICAL_OINTMENT | OPHTHALMIC | Status: AC
  Filled 2018-08-02: qty 3.5

## 2018-08-02 MED ORDER — METHOCARBAMOL 500 MG PO TABS
ORAL_TABLET | ORAL | Status: AC
  Filled 2018-08-02: qty 1

## 2018-08-02 MED ORDER — DEXAMETHASONE SODIUM PHOSPHATE 10 MG/ML IJ SOLN
INTRAMUSCULAR | Status: AC
  Filled 2018-08-02: qty 1

## 2018-08-02 MED ORDER — SODIUM CHLORIDE 0.9 % IV SOLN: 250.0000 mL | INTRAVENOUS | Status: DC

## 2018-08-02 MED ORDER — GLYCOPYRROLATE PF 0.2 MG/ML IJ SOSY
PREFILLED_SYRINGE | INTRAMUSCULAR | Status: AC
  Filled 2018-08-02: qty 1

## 2018-08-02 MED ORDER — PHENYLEPHRINE 40 MCG/ML (10ML) SYRINGE FOR IV PUSH (FOR BLOOD PRESSURE SUPPORT)
PREFILLED_SYRINGE | INTRAVENOUS | Status: DC | PRN
  Administered 2018-08-02: 80 ug via INTRAVENOUS

## 2018-08-02 MED ORDER — PHENYLEPHRINE 40 MCG/ML (10ML) SYRINGE FOR IV PUSH (FOR BLOOD PRESSURE SUPPORT)
PREFILLED_SYRINGE | INTRAVENOUS | Status: AC
  Filled 2018-08-02: qty 10

## 2018-08-02 MED ORDER — METHOCARBAMOL 1000 MG/10ML IJ SOLN
500.0000 mg | Freq: Four times a day (QID) | INTRAVENOUS | Status: DC | PRN
  Filled 2018-08-02: qty 5

## 2018-08-02 SURGICAL SUPPLY — 72 items
ADH SKN CLS APL DERMABOND .7 (GAUZE/BANDAGES/DRESSINGS) ×2
AGENT HMST KT MTR STRL THRMB (HEMOSTASIS) ×2
APL SKNCLS STERI-STRIP NONHPOA (GAUZE/BANDAGES/DRESSINGS) ×2
BANDAGE ACE 4X5 VEL STRL LF (GAUZE/BANDAGES/DRESSINGS) ×3 IMPLANT
BENZOIN TINCTURE PRP APPL 2/3 (GAUZE/BANDAGES/DRESSINGS) ×3 IMPLANT
BIT MILLING 2.35X33.5 SHAFT (BIT) ×3 IMPLANT
BLADE CLIPPER SURG (BLADE) IMPLANT
BNDG CMPR 9X4 STRL LF SNTH (GAUZE/BANDAGES/DRESSINGS) ×2
BNDG ESMARK 4X9 LF (GAUZE/BANDAGES/DRESSINGS) ×3 IMPLANT
BUR ROUND FLUTED 4 SOFT TCH (BURR) ×3 IMPLANT
CANISTER SUCTION WELLS/JOHNSON (MISCELLANEOUS) ×3 IMPLANT
COLLAR CERV LO CONTOUR FIRM DE (SOFTGOODS) ×3 IMPLANT
CORD BIPOLAR FORCEPS 12FT (ELECTRODE) ×3 IMPLANT
CORDS BIPOLAR (ELECTRODE) ×3 IMPLANT
COVER MAYO STAND STRL (DRAPES) ×12 IMPLANT
COVER SURGICAL LIGHT HANDLE (MISCELLANEOUS) ×6 IMPLANT
COVER WAND RF STERILE (DRAPES) ×3 IMPLANT
CUFF TOURNIQUET SINGLE 18IN (TOURNIQUET CUFF) ×3 IMPLANT
CUFF TOURNIQUET SINGLE 24IN (TOURNIQUET CUFF) IMPLANT
DERMABOND ADVANCED (GAUZE/BANDAGES/DRESSINGS) ×1
DERMABOND ADVANCED .7 DNX12 (GAUZE/BANDAGES/DRESSINGS) ×2 IMPLANT
DISC PRODISC-C MED DEEP 5MM (Neuro Prosthesis/Implant) ×3 IMPLANT
DRAPE C-ARM 42X72 X-RAY (DRAPES) ×3 IMPLANT
DRAPE EXTREMITY T 121X128X90 (DRAPE) ×3 IMPLANT
DRAPE HALF SHEET 40X57 (DRAPES) ×15 IMPLANT
DRAPE MICROSCOPE LEICA (MISCELLANEOUS) ×3 IMPLANT
DRAPE POUCH INSTRU U-SHP 10X18 (DRAPES) ×3 IMPLANT
DRAPE SURG 17X23 STRL (DRAPES) ×3 IMPLANT
DRSG MEPILEX BORDER 4X4 (GAUZE/BANDAGES/DRESSINGS) IMPLANT
DRSG MEPILEX BORDER 4X8 (GAUZE/BANDAGES/DRESSINGS) IMPLANT
DURAPREP 26ML APPLICATOR (WOUND CARE) ×6 IMPLANT
DURAPREP 6ML APPLICATOR 50/CS (WOUND CARE) IMPLANT
ELECT COATED BLADE 2.86 ST (ELECTRODE) ×3 IMPLANT
ELECT REM PT RETURN 9FT ADLT (ELECTROSURGICAL) ×3
ELECTRODE REM PT RTRN 9FT ADLT (ELECTROSURGICAL) ×2 IMPLANT
EVACUATOR 1/8 PVC DRAIN (DRAIN) ×3 IMPLANT
GAUZE SPONGE 4X4 12PLY STRL (GAUZE/BANDAGES/DRESSINGS) ×6 IMPLANT
GAUZE XEROFORM 1X8 LF (GAUZE/BANDAGES/DRESSINGS) ×3 IMPLANT
GLOVE BIOGEL PI IND STRL 8 (GLOVE) ×4 IMPLANT
GLOVE BIOGEL PI INDICATOR 8 (GLOVE) ×2
GLOVE ORTHO TXT STRL SZ7.5 (GLOVE) ×6 IMPLANT
GOWN STRL REUS W/ TWL LRG LVL3 (GOWN DISPOSABLE) ×2 IMPLANT
GOWN STRL REUS W/ TWL XL LVL3 (GOWN DISPOSABLE) ×4 IMPLANT
GOWN STRL REUS W/TWL 2XL LVL3 (GOWN DISPOSABLE) ×3 IMPLANT
GOWN STRL REUS W/TWL LRG LVL3 (GOWN DISPOSABLE) ×3
GOWN STRL REUS W/TWL XL LVL3 (GOWN DISPOSABLE) ×6
HEAD HALTER (SOFTGOODS) ×3 IMPLANT
HEMOSTAT SURGICEL 2X14 (HEMOSTASIS) IMPLANT
KIT BASIN OR (CUSTOM PROCEDURE TRAY) ×3 IMPLANT
KIT TURNOVER KIT B (KITS) ×3 IMPLANT
MANIFOLD NEPTUNE II (INSTRUMENTS) IMPLANT
NEEDLE 25GX 5/8IN NON SAFETY (NEEDLE) ×3 IMPLANT
NS IRRIG 1000ML POUR BTL (IV SOLUTION) ×3 IMPLANT
PACK ORTHO CERVICAL (CUSTOM PROCEDURE TRAY) ×3 IMPLANT
PACK ORTHO EXTREMITY (CUSTOM PROCEDURE TRAY) IMPLANT
PAD ARMBOARD 7.5X6 YLW CONV (MISCELLANEOUS) ×6 IMPLANT
PATTIES SURGICAL .5 X.5 (GAUZE/BANDAGES/DRESSINGS) IMPLANT
STAPLER VISISTAT 35W (STAPLE) IMPLANT
STOCKINETTE TUBULAR COTT 4X25 (GAUZE/BANDAGES/DRESSINGS) ×3 IMPLANT
STRIP CLOSURE SKIN 1/2X4 (GAUZE/BANDAGES/DRESSINGS) ×3 IMPLANT
SUCTION FRAZIER HANDLE 10FR (MISCELLANEOUS) ×1
SUCTION TUBE FRAZIER 10FR DISP (MISCELLANEOUS) ×2 IMPLANT
SURGIFLO W/THROMBIN 8M KIT (HEMOSTASIS) ×3 IMPLANT
SUT ETHILON 4 0 PS 2 18 (SUTURE) ×3 IMPLANT
SUT VIC AB 3-0 X1 27 (SUTURE) ×3 IMPLANT
SUT VICRYL 4-0 PS2 18IN ABS (SUTURE) ×6 IMPLANT
SYR 30ML SLIP (SYRINGE) ×3 IMPLANT
SYR BULB 3OZ (MISCELLANEOUS) ×3 IMPLANT
TIP INSERTER MEDIUM (INSTRUMENTS) ×3 IMPLANT
TOWEL OR 17X24 6PK STRL BLUE (TOWEL DISPOSABLE) ×3 IMPLANT
TOWEL OR 17X26 10 PK STRL BLUE (TOWEL DISPOSABLE) ×3 IMPLANT
TUBE CONNECTING 12X1/4 (SUCTIONS) ×3 IMPLANT

## 2018-08-02 NOTE — H&P (Signed)
Paul Huff is an 47 y.o. male.   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  Progressive worsening symptoms.  Failed conservative treatment.  Past Medical History:  Diagnosis Date  . Acid reflux   . Sciatica     Past Surgical History:  Procedure Laterality Date  . TONSILLECTOMY  1985    Family History  Adopted: Yes   Social History:  reports that he quit smoking about 13 months ago. He has never used smokeless tobacco. He reports that he drinks about 8.0 standard drinks of alcohol per week. He reports that he has current or past drug history. Drug: Marijuana.  Allergies: No Known Allergies  Medications Prior to Admission  Medication Sig Dispense Refill  . HYDROcodone-acetaminophen (NORCO/VICODIN) 5-325 MG tablet Take 1 tablet by mouth every 6 (six) hours as needed for moderate pain. 30 tablet 0    No results found for this or any previous visit (from the past 48 hour(s)). No results found.  Review of Systems  Constitutional: Negative.   HENT: Negative.   Respiratory: Negative.   Cardiovascular: Negative.   Gastrointestinal: Negative.   Musculoskeletal: Positive for neck pain.  Skin: Negative.   Neurological: Positive for tingling.  Psychiatric/Behavioral: Negative.     Blood pressure (!) 127/95, pulse 73, temperature 98.7 F (37.1 C), temperature source Oral, resp. rate 20, SpO2 99 %. Physical Exam  Constitutional: He is oriented to person, place, and time. He appears well-developed and well-nourished. No distress.  HENT:  Head: Normocephalic.  Eyes: Pupils are equal, round, and reactive to light. EOM are normal.  Neck:  Left brachial plexus tenderness  Cardiovascular: Normal heart sounds.  Respiratory: Breath sounds normal. No respiratory distress.  GI: He exhibits no distension. There is no  tenderness.  Musculoskeletal: He exhibits tenderness.  Neurological: He is alert and oriented to person, place, and time.  Skin: Skin is warm and dry.  Psychiatric: He has a normal mood and affect.     Assessment/Plan Left C3-4 HNP, left carpal tunnel syndrome   We will proceed with C3-4 disc arthroplasty and left carpal tunnel release as scheduled.  Surgery procedure along with potential rehab/recovery time discussed.  All questions answered and wishes to proceed. Zonia Kief, PA-C 08/02/2018, 12:20 PM

## 2018-08-02 NOTE — Transfer of Care (Signed)
Immediate Anesthesia Transfer of Care Note  Patient: Paul Huff  Procedure(s) Performed: C3-4 CERVICAL DISC ARTHROPLASTY (1st) (N/A Spine Cervical) LEFT CARPAL TUNNEL RELEASE (2nd) (Left Wrist)  Patient Location: PACU  Anesthesia Type:General  Level of Consciousness: drowsy  Airway & Oxygen Therapy: Patient Spontanous Breathing and Patient connected to nasal cannula oxygen  Post-op Assessment: Report given to RN and Post -op Vital signs reviewed and stable  Post vital signs: Reviewed and stable  Last Vitals:  Vitals Value Taken Time  BP 132/92 08/02/2018  3:38 PM  Temp    Pulse 88 08/02/2018  3:42 PM  Resp 17 08/02/2018  3:42 PM  SpO2 95 % 08/02/2018  3:42 PM  Vitals shown include unvalidated device data.  Last Pain:  Vitals:   08/02/18 1540  TempSrc:   PainSc: (P) Asleep         Complications: No apparent anesthesia complications

## 2018-08-02 NOTE — Anesthesia Postprocedure Evaluation (Signed)
Anesthesia Post Note  Patient: Paul Huff  Procedure(s) Performed: C3-4 CERVICAL DISC ARTHROPLASTY (1st) (N/A Spine Cervical) LEFT CARPAL TUNNEL RELEASE (2nd) (Left Wrist)     Patient location during evaluation: PACU Anesthesia Type: General Level of consciousness: awake and alert and oriented Pain management: pain level controlled Vital Signs Assessment: post-procedure vital signs reviewed and stable Respiratory status: spontaneous breathing, nonlabored ventilation, respiratory function stable and patient connected to nasal cannula oxygen Cardiovascular status: blood pressure returned to baseline and stable Postop Assessment: no apparent nausea or vomiting Anesthetic complications: no    Last Vitals:  Vitals:   08/02/18 1545 08/02/18 1550  BP:  128/84  Pulse: 92 91  Resp: 18 16  Temp:    SpO2: 93% 95%    Last Pain:  Vitals:   08/02/18 1600  TempSrc:   PainSc: 10-Worst pain ever                 Ekam Besson A.

## 2018-08-02 NOTE — Op Note (Signed)
Preop diagnosis: C3-C4 disc protrusion with cervical stenosis and an abnormal cord signal.  Left carpal tunnel syndrome.  Postop diagnosis: Same  Procedure: Left cervical disc arthroplasty Pro-disc C medium deep 5 mm.  14 mm deep x15 mm wide (Synthes Depuy ), Left carpal tunnel release.  Surgeon: Annell Greening, MD  Assistant: Zonia Kief, PA-C medically necessary and present for the entire procedure  Anesthesia General plus Marcaine skin local 5 cc carpal tunnel.  Tourniquet time 5 minutes left arm x250.  Procedure: After induction of general anesthesia intubation head halter traction application arms tucked at the side with careful padding and calf pumpers applied C-arm was brought in to make sure the C3-4 level could be well visualized for placement of the prosthesis.  Once this was done neck was prepped with DuraPrep there is squared with towels sterile skin marker Betadine Steri-Drape sterile male standard the head and thyroid sheets and drapes were applied.  Timeout procedure was completed.  Incision was made proximal skin fold starting at the midline extending to the left high in the neck based on palpable landmarks.  Platysma was divided in line with skin incision and blunt dissection was performed down to the C4-5 vertebrae.  Spurs are noted at C5-6 and at C3-4.  Patient had multilevel spondylosis in his neck but there was cord abnormality at the C3-4 level where he had severe stenosis and for that reason with the multilevel involvement disc arthroplasty was selected rather than cervical fusion.  Needle was moved to C3-4 confirmed the lateral radiograph.  Operative microscope was draped brought in.  Disc was removed progressing to the posterior longitudinal ligament.  Once portion of the posterior longitudinal ligament was taken down and protruding disc had been decompressed uncovertebral joints were maintained right and left and trial sizers 6 and a 5 was inserted.  Head halter traction was  pulled by the CRNA and the 5 appeared to be the appropriate height to restore the disc space height.  Trial was inserted and checking under C arm and was purposely position in midline and it was elected to pass a tower over the trial and make the keel cuts using the pencil drill sweeping back toward the trial and then cleaning the slots of pieces of bone.  Disc arthroplasty was inserted initially did not want to completely seat.  It was halfway and it was backed out using the operative microscope the keel slots were carefully cleaned and had some Latini pieces of bone that were jammed in preventing gentle progression of the disc arthroplasty.  Once sites were clean it was able to be progressed and countersunk 2 mm she was in good position AP and lateral center on AP and posterior to the anterior cortex.  Some Surgi-Flo been placed in the epidural space was suctioned out prior to placement of the disc arthroplasty.  Final spot pictures were taken Hemovac was placed in and out technique platysma closed with 3-0 Vicryl 4-0 Vicryl subcuticular closure tincture benzoin Steri-Strips 4 x 4's tape and a soft collar was applied.  Table was then rotated proximal arm tourniquet applied for the left arm and arm was prepped with DuraPrep mini timeout was done again no repeat antibiotics was necessary.  After DuraPrep extremity sheets and drapes stockinette arm is wrapped in Esmarch after sterile skin marker tourniquet inflated.  Based on Cardinal Kaplan lines the median nerve was identified distally following along the ulnar aspect extending proximally dividing the transverse carpal ligament.  There is some flattening  of the nerve only mild hyper erythema and some chronic synovitis was noted.  Fingertip was inserted proximally and distally.  Transverse arch was noted in the palm and there was no compression proximal to the wrist crease.  Irrigation with saline solution deflation of the tourniquet bipolar cautery had been used.   4-0 nylon in the skin Xeroform 4 x 4's 20 nurse after Marcaine infiltration Ace wrap and transfer the care of room in stable condition.

## 2018-08-02 NOTE — Anesthesia Procedure Notes (Signed)
Procedure Name: Intubation Date/Time: 08/02/2018 12:44 PM Performed by: Elliot Dally, CRNA Pre-anesthesia Checklist: Patient identified, Emergency Drugs available, Suction available and Patient being monitored Patient Re-evaluated:Patient Re-evaluated prior to induction Oxygen Delivery Method: Circle System Utilized Preoxygenation: Pre-oxygenation with 100% oxygen Induction Type: IV induction Ventilation: Mask ventilation without difficulty Laryngoscope Size: Miller and 3 Grade View: Grade I Tube type: Oral Tube size: 7.5 mm Number of attempts: 1 Airway Equipment and Method: Stylet and Oral airway Placement Confirmation: ETT inserted through vocal cords under direct vision,  positive ETCO2 and breath sounds checked- equal and bilateral Secured at: 22 cm Tube secured with: Tape Dental Injury: Teeth and Oropharynx as per pre-operative assessment

## 2018-08-02 NOTE — H&P (Signed)
Office Visit Note              Patient: Paul Huff                                         Date of Birth: 1971/09/11                                                    MRN: [id number] Visit Date: 07/18/2018                                                                     Requested by: Shirline Frees, NP [address] [place], [place] [address] PCP: Shirline Frees, NP   Assessment & Plan: Visit Diagnoses:  1. Spinal stenosis of cervical region   2. Other cervical disc degeneration, unspecified cervical region   3. Carpal tunnel syndrome, left upper limb   4. Cervical disc herniation            At C3-4 with cord abnormal signal  Plan: Patient has large C3-4 cervical disc herniation left paracentral with cord compression and abnormal cord signal.  Normally is single level cervical fusion would be recommended however he has additional disc protrusions at other levels at C4-5, C5-6 and C6-7 with mild to moderate stenosis at these levels with some cord flattening.  He is having bilateral upper extremity weakness worse on the left than right and also additionally has some mild carpal tunnel syndrome symptoms.  We discussed that some of his left hand numbness may be related to the damage down to the cord from the large disc herniation.  With his multilevel other changes recommendation would be single level cervical disc arthroplasty at C3-4 with removal of the large disc herniation using operative microscope.  He understands that with time he may get progression at other levels and may require surgery at those other levels in the future.  He has numbness with his left hand with typing and I recommend proceeding with carpal tunnel procedure under the same anesthetic after the cervical disc arthroplasty is done since is difficult to determine whether the hand numbness symptoms are related to the severe cord compression at C3-4 or to his carpal tunnel syndrome with prolongation of  distal peak latency at the median nerve.  We discussed he be out of work for a period of at least 4 weeks after the disc arthroplasty.  Overnight stay in the hospital use of a drain.  He understands that some of the changes in his cord may be permanent and the surgery is to prevent progression of the cord damage.  Fortunately he is not having significant myelopathic symptoms at this time.  We discussed general anesthesia, overnight stay use of a Hemovac drain that remove the following morning.  Questions were elicited and answered.  Patient understands and requests we proceed.  He states he had already discussed with Dr. Otelia Sergeant the disc arthroplasty and has a good understanding of what the procedure entails.  We discussed care and limiting his activities until the surgery to prevent further damage to the cord.  Follow-Up Instructions: No follow-ups on file.   Orders:  No orders of the defined types were placed in this encounter.      Meds ordered this encounter  Medications  . HYDROcodone-acetaminophen (NORCO/VICODIN) 5-325 MG tablet    Sig: Take 1 tablet by mouth every 6 (six) hours as needed for moderate pain.    Dispense:  30 tablet    Refill:  0      Procedures: No procedures performed   Clinical Data: No additional findings.   Subjective:     Chief Complaint  Patient presents with  . Right Hand - Follow-up    NCS review  . Left Hand - Follow-up    NCS Review    HPI 47 year old male with chronic neck pain and left greater than right arm pain with numbness.  Patient has multilevel disc degeneration in the cervical spine with the C3-4 large central disc herniation with cord changes and was sent to me by Dr. Vira Browns for C3-4 cervical disc arthroplasty.  He has positive EMGs nerve conduction velocities consistent with some left carpal tunnel syndrome.  He drops objects and is used night splints without relief.  Patient has cervical stenosis at C3-4 with  narrowing of the thecal sac down to 6 mm with T2 cord signal changes of the cervical spine at C3-4.  Additionally has some left paracentral disc protrusions without severe stenosis in its rated at mild to moderate at C4-5, C5-6 and C6-7.  He has had significant increased pain with upper gazing.  Difficulty lifting his left arm.  He has not had any falling episodes and still is been able to walk.  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  Pain in his neck wakes him up at night and he has to shake his left hand.  Currently is working for [employer]. prior to his current position he worked for [employer] had stopped this due to some problems with back pain and sciatica.  Review of Systems 14 point review of systems updated and is unchanged from 06/07/2018 office visit with Dr. Otelia Sergeant.  Of note his neck pain neck stiffness cervical stenosis, left carpal tunnel syndrome.  History of back pain with back stiffness.   Objective: Vital Signs: BP (!) 144/96   Pulse 94   Ht 5\' 11"  (1.803 m)   Wt 245 lb (111.1 kg)   BMI 34.17 kg/m   Physical Exam  Constitutional: He is oriented to person, place, and time. He appears well-developed and well-nourished.  HENT:  Head: Normocephalic and atraumatic.  Eyes: Pupils are equal, round, and reactive to light. EOM are normal.  Neck: No tracheal deviation present. No thyromegaly present.  Cardiovascular: Normal rate.  Pulmonary/Chest: Effort normal. He has no wheezes.  Abdominal: Soft. Bowel sounds are normal.  Neurological: He is alert and oriented to person, place, and time.  Skin: Skin is warm and dry. Capillary refill takes less than 2 seconds.  Psychiatric: He has a normal mood and affect. His behavior is normal. Judgment and thought content normal.    Ortho Exam patient has 30% neck extension with sharp pain.  Forward flexion chin 4 fingerbreadths to the chest with positive Lhermitte.  Bilateral brachial plexus tenderness both right  and left.  No lower extremity clonus no lower extremity hyperreflexia.  Quads anterior tib gastrocsoleus is strong.  Specialty Comments:  No  specialty comments available.  Imaging:CLINICAL DATA: Initial evaluation for left-sided neck pain radiating into the left upper extremity for 36 hours.  EXAM: MRI CERVICAL SPINE WITHOUT AND WITH CONTRAST  TECHNIQUE: Multiplanar and multiecho pulse sequences of the cervical spine, to include the craniocervical junction and cervicothoracic junction, were obtained without and with intravenous contrast.  CONTRAST: 20mL MULTIHANCE GADOBENATE DIMEGLUMINE 529 MG/ML IV SOLN  COMPARISON: None.  FINDINGS: Alignment: Straightening of the normal cervical lordosis. No listhesis.  Vertebrae: Vertebral body heights maintained without evidence for acute or chronic fracture. Bone marrow signal intensity within normal limits. No discrete or worrisome osseous lesions. No abnormal marrow edema or enhancement.  Cord: Suspected subtle T2 cord signal abnormality within the cervical spinal cord at the level of C3-4 related to stenosis (series 4, image 7). Signal intensity within the cervical spinal cord is otherwise normal.  Posterior Fossa, vertebral arteries, paraspinal tissues: Visualized brain and posterior fossa within normal limits. Craniocervical junction normal. Paraspinous and prevertebral soft tissues demonstrate no acute finding. Normal intravascular flow voids present within the vertebral arteries bilaterally.  Disc levels:  C2-C3: Unremarkable.  C3-C4: Broad left paracentral disc protrusion indents and effaces the central and left ventral thecal sac (series 6, image 17). Slight cephalad and caudad migration of disc material. Protruding disc flattens the cervical spinal cord with probable subtle cord signal abnormality. Moderate to severe spinal stenosis with the thecal sac measuring 6 mm in AP diameter at its most narrow  point. Protruding disc extends laterally into the left neural foramen with associated severe left C4 foraminal stenosis. Superimposed uncovertebral spurring with moderate right foraminal narrowing as well.  C4-C5: Small left paracentral disc protrusion indents the left ventral thecal sac. Mild flattening of the left hemi cord without cord signal changes. Mild spinal stenosis. Foramina remain patent.  C5-C6: Left paracentral disc protrusion indents the left ventral thecal sac. Flattening of the left hemi cord without cord signal changes. Mild to moderate left-sided spinal stenosis. Foramina remain patent.  C6-C7: Broad left paracentral disc protrusion indents the central and left ventral thecal sac. Flattening of the left hemi cord without cord signal changes. Moderate left-sided spinal stenosis. Mild bilateral C7 foraminal stenosis.  C7-T1: Mild facet hypertrophy on the right. No significant stenosis.  Visualized upper thoracic spine demonstrates no significant finding.  IMPRESSION: 1. Left paracentral disc protrusion at C3-4 with lateral extension into the left neural foramen, with resultant moderate to severe canal and severe left C4 foraminal stenosis. Subtle cord signal abnormality at this level suspicious for mild edema and/or myelomalacia. 2. Additional left paracentral disc protrusions at C4-5, C5-6, and C6-7 with resultant mild to moderate spinal stenosis as above. Secondary cord flattening at these levels without cord signal changes.   Electronically Signed By: Rise Mu M.D. On: 03/31/2018 19:14    PMFS History:     Patient Active Problem List   Diagnosis Date Noted  . Spinal stenosis of cervical region 07/21/2018       Past Medical History:  Diagnosis Date  . Acid reflux   . Sciatica     Family History  Adopted: Yes         Past Surgical History:  Procedure Laterality Date  . TONSILLECTOMY  1985   Social History          Occupational History  . Not on file  Tobacco Use  . Smoking status: Former Games developer  . Smokeless tobacco: Never Used  Substance and Sexual Activity  . Alcohol use: Yes  Alcohol/week: 8.0 standard drinks    Types: 8 Shots of liquor per week    Comment: social drinking on weekend  . Drug use: Yes    Types: Marijuana  . Sexual activity: Not Currently          Electronically signed by [name], MD at 07/21/2018 11:46 AM Electronically signed by [name], MD at 07/21/2018 11:47 AM

## 2018-08-02 NOTE — Interval H&P Note (Signed)
History and Physical Interval Note:  08/02/2018 12:29 PM  Paul Huff  has presented today for surgery, with the diagnosis of C3-4 Cervical stenosis, left carpal tunnel syndrome  The various methods of treatment have been discussed with the patient and family. After consideration of risks, benefits and other options for treatment, the patient has consented to  Procedure(s): C3-4 CERVICAL DISC ARTHROPLASTY (1st) (N/A) LEFT CARPAL TUNNEL RELEASE (2nd) (Left) as a surgical intervention .  The patient's history has been reviewed, patient examined, no change in status, stable for surgery.  I have reviewed the patient's chart and labs.  Questions were answered to the patient's satisfaction.     [name]

## 2018-08-03 ENCOUNTER — Encounter (HOSPITAL_COMMUNITY): Payer: Self-pay | Admitting: Orthopaedic Surgery

## 2018-08-03 DIAGNOSIS — M4802 Spinal stenosis, cervical region: Secondary | ICD-10-CM | POA: Diagnosis not present

## 2018-08-03 NOTE — Progress Notes (Signed)
Patient is discharged from room 3C11 at this time. Alert and in stable condition. IV site d/c'd and instructions read to patient and spouse with understanding verbalized. Left unit via wheelchair with all belongings at side. 

## 2018-08-03 NOTE — Progress Notes (Signed)
Patient ID: Paul Huff, male   DOB: [birth year], 47 y.o.   MRN: [id number]   Subjective: 1 Day Post-Op Procedure(s) (LRB): C3-4 CERVICAL DISC ARTHROPLASTY (1st) (N/A) LEFT CARPAL TUNNEL RELEASE (2nd) (Left) Patient reports pain as mild.    Objective: Vital signs in last 24 hours: Temp:  [97.6 F (36.4 C)-98.7 F (37.1 C)] 97.6 F (36.4 C) (11/07 0720) Pulse Rate:  [73-95] 73 (11/07 0720) Resp:  [9-21] 16 (11/07 0720) BP: (123-145)/(83-99) 123/92 (11/07 0720) SpO2:  [91 %-100 %] 99 % (11/07 0720)  Intake/Output from previous day: 11/06 0701 - 11/07 0700 In: 1300 [I.V.:1300] Out: 60 [Drains:10; Blood:50] Intake/Output this shift: No intake/output data recorded.  No results for input(s): HGB in the last 72 hours. No results for input(s): WBC, RBC, HCT, PLT in the last 72 hours. No results for input(s): NA, K, CL, CO2, BUN, CREATININE, GLUCOSE, CALCIUM in the last 72 hours. No results for input(s): LABPT, INR in the last 72 hours.  up walking , arms and legs feel stronger.  Dg Cervical Spine 2-3 Views  Result Date: 08/02/2018 CLINICAL DATA:  C3-4 disc arthroplasty EXAM: DG C-ARM 61-120 MIN; CERVICAL SPINE - 2-3 VIEW COMPARISON:  MRI 03/31/2018 FINDINGS: Two low resolution intraoperative spot views of the cervical spine. Total fluoroscopy time was 33 seconds. Interbody device at C3-C4. IMPRESSION: Intraoperative fluoroscopic assistance provided during cervical spine surgery Electronically Signed   By: Jasmine Pang M.D.   On: 08/02/2018 15:13   Dg C-arm 1-60 Min  Result Date: 08/02/2018 CLINICAL DATA:  C3-4 disc arthroplasty EXAM: DG C-ARM 61-120 MIN; CERVICAL SPINE - 2-3 VIEW COMPARISON:  MRI 03/31/2018 FINDINGS: Two low resolution intraoperative spot views of the cervical spine. Total fluoroscopy time was 33 seconds. Interbody device at C3-C4. IMPRESSION: Intraoperative fluoroscopic assistance provided during cervical spine surgery Electronically Signed   By: Jasmine Pang M.D.   On: 08/02/2018 15:13   Dg C-arm 1-60 Min  Result Date: 08/02/2018 CLINICAL DATA:  C3-4 disc arthroplasty EXAM: DG C-ARM 61-120 MIN; CERVICAL SPINE - 2-3 VIEW COMPARISON:  MRI 03/31/2018 FINDINGS: Two low resolution intraoperative spot views of the cervical spine. Total fluoroscopy time was 33 seconds. Interbody device at C3-C4. IMPRESSION: Intraoperative fluoroscopic assistance provided during cervical spine surgery Electronically Signed   By: Jasmine Pang M.D.   On: 08/02/2018 15:13    Assessment/Plan: 1 Day Post-Op Procedure(s) (LRB): C3-4 CERVICAL DISC ARTHROPLASTY (1st) (N/A) LEFT CARPAL TUNNEL RELEASE (2nd) (Left) Plan;  Discharge home. Office one week  Paul Huff 08/03/2018, 8:05 AM

## 2018-08-04 NOTE — Discharge Summary (Signed)
Patient ID: Paul Huff MRN: [id number] DOB/AGE: 1971-04-25 47 y.o.  Admit date: 08/02/2018 Discharge date: 08/04/2018  Admission Diagnoses:  Active Problems:   HNP (herniated nucleus pulposus), cervical   Discharge Diagnoses:  Active Problems:   HNP (herniated nucleus pulposus), cervical  status post Procedure(s): C3-4 CERVICAL DISC ARTHROPLASTY (1st) LEFT CARPAL TUNNEL RELEASE (2nd)  Past Medical History:  Diagnosis Date  . Acid reflux   . Sciatica     Surgeries: Procedure(s): C3-4 CERVICAL DISC ARTHROPLASTY (1st) LEFT CARPAL TUNNEL RELEASE (2nd) on 08/02/2018   Consultants:   Discharged Condition: Improved  Hospital Course: Paul Huff is an 47 y.o. male who was admitted 08/02/2018 for operative treatment of C3-4 HNP and left carpal tunnel syndrome.  Patient failed conservative treatments (please see the history and physical for the specifics) and had severe unremitting pain that affects sleep, daily activities and work/hobbies. After pre-op clearance, the patient was taken to the operating room on 08/02/2018 and underwent  Procedure(s): C3-4 CERVICAL DISC ARTHROPLASTY (1st) LEFT CARPAL TUNNEL RELEASE (2nd).    Patient was given perioperative antibiotics:  Anti-infectives (From admission, onward)   Start     Dose/Rate Route Frequency Ordered Stop   08/02/18 2045  ceFAZolin (ANCEF) IVPB 1 g/50 mL premix     1 g 100 mL/hr over 30 Minutes Intravenous Every 8 hours 08/02/18 1720 08/03/18 0918   08/02/18 1045  ceFAZolin (ANCEF) IVPB 2g/100 mL premix     2 g 200 mL/hr over 30 Minutes Intravenous On call to O.R. 08/02/18 1035 08/02/18 1252   08/02/18 1044  ceFAZolin (ANCEF) 2-4 GM/100ML-% IVPB    Note to Pharmacy:  Rogelia Mire   : cabinet override      08/02/18 1044 08/02/18 1252       Patient was given sequential compression devices and early ambulation to prevent DVT.   Patient benefited maximally from hospital stay and there were no  complications. At the time of discharge, the patient was urinating/moving their bowels without difficulty, tolerating a regular diet, pain is controlled with oral pain medications and they have been cleared by PT/OT.   Recent vital signs: No data found.   Recent laboratory studies: No results for input(s): WBC, HGB, HCT, PLT, NA, K, CL, CO2, BUN, CREATININE, GLUCOSE, INR, CALCIUM in the last 72 hours.  Invalid input(s): PT, 2   Discharge Medications:   Allergies as of 08/03/2018   No Known Allergies     Medication List    STOP taking these medications   HYDROcodone-acetaminophen 5-325 MG tablet Commonly known as:  NORCO/VICODIN     TAKE these medications   methocarbamol 500 MG tablet Commonly known as:  ROBAXIN Take 1 tablet (500 mg total) by mouth 4 (four) times daily.   oxyCODONE-acetaminophen 5-325 MG tablet Commonly known as:  PERCOCET/ROXICET Take 1-2 tablets by mouth every 6 (six) hours as needed for severe pain.       Diagnostic Studies: Dg Chest 2 View  Result Date: 07/26/2018 CLINICAL DATA:  Preop for surgical clearance, smoking history EXAM: CHEST - 2 VIEW COMPARISON:  Chest x-ray of 04/09/2017 FINDINGS: No active infiltrate or effusion is seen. Mediastinal and hilar contours contours are unremarkable other than peribronchial thickening which may indicate chronic bronchitis in this patient with a smoking history. The heart is within normal limits in size. No bony abnormality is seen. IMPRESSION: No pneumonia or pleural effusion. Peribronchial thickening may indicate chronic bronchitis in this patient with a smoking history. Electronically Signed  By: Dwyane Dee M.D.   On: 07/26/2018 15:05   Dg Cervical Spine 2-3 Views  Result Date: 08/02/2018 CLINICAL DATA:  C3-4 disc arthroplasty EXAM: DG C-ARM 61-120 MIN; CERVICAL SPINE - 2-3 VIEW COMPARISON:  MRI 03/31/2018 FINDINGS: Two low resolution intraoperative spot views of the cervical spine. Total fluoroscopy time was 33  seconds. Interbody device at C3-C4. IMPRESSION: Intraoperative fluoroscopic assistance provided during cervical spine surgery Electronically Signed   By: Jasmine Pang M.D.   On: 08/02/2018 15:13   Dg C-arm 1-60 Min  Result Date: 08/02/2018 CLINICAL DATA:  C3-4 disc arthroplasty EXAM: DG C-ARM 61-120 MIN; CERVICAL SPINE - 2-3 VIEW COMPARISON:  MRI 03/31/2018 FINDINGS: Two low resolution intraoperative spot views of the cervical spine. Total fluoroscopy time was 33 seconds. Interbody device at C3-C4. IMPRESSION: Intraoperative fluoroscopic assistance provided during cervical spine surgery Electronically Signed   By: Jasmine Pang M.D.   On: 08/02/2018 15:13   Dg C-arm 1-60 Min  Result Date: 08/02/2018 CLINICAL DATA:  C3-4 disc arthroplasty EXAM: DG C-ARM 61-120 MIN; CERVICAL SPINE - 2-3 VIEW COMPARISON:  MRI 03/31/2018 FINDINGS: Two low resolution intraoperative spot views of the cervical spine. Total fluoroscopy time was 33 seconds. Interbody device at C3-C4. IMPRESSION: Intraoperative fluoroscopic assistance provided during cervical spine surgery Electronically Signed   By: Jasmine Pang M.D.   On: 08/02/2018 15:13        Discharge Plan:  discharge to home  Disposition:     Signed: Zonia Kief  08/04/2018, 3:00 PM

## 2018-08-09 ENCOUNTER — Ambulatory Visit (INDEPENDENT_AMBULATORY_CARE_PROVIDER_SITE_OTHER): Payer: BLUE CROSS/BLUE SHIELD | Admitting: Orthopaedic Surgery

## 2018-08-09 ENCOUNTER — Encounter (INDEPENDENT_AMBULATORY_CARE_PROVIDER_SITE_OTHER): Payer: Self-pay | Admitting: Orthopaedic Surgery

## 2018-08-09 ENCOUNTER — Ambulatory Visit (INDEPENDENT_AMBULATORY_CARE_PROVIDER_SITE_OTHER): Payer: Self-pay

## 2018-08-09 VITALS — BP 143/98 | HR 76 | Ht 71.0 in | Wt 251.0 lb

## 2018-08-09 DIAGNOSIS — M25532 Pain in left wrist: Secondary | ICD-10-CM | POA: Diagnosis not present

## 2018-08-09 DIAGNOSIS — M5020 Other cervical disc displacement, unspecified cervical region: Secondary | ICD-10-CM

## 2018-08-09 DIAGNOSIS — Z9889 Other specified postprocedural states: Secondary | ICD-10-CM

## 2018-08-09 NOTE — Progress Notes (Signed)
   Post-Op Visit Note   Patient: Paul Huff           Date of Birth: [birth year]           MRN: [id number] Visit Date: 08/09/2018 PCP: Shirline FreesNafziger, Cory, NP   Assessment & Plan: Return 1 week for suture removal left carpal tunnel.  Chief Complaint:  Chief Complaint  Patient presents with  . Neck - Routine Post Op    08/02/18 C3-4 cervical disc arthroplasty  . Left Wrist - Routine Post Op    08/02/18 Left CTR   Visit Diagnoses:  1. Cervical disc herniation     Plan: Post cervical disc arthroplasty C3-4 and left carpal tunnel release.  Wrist splint given incision looks good both areas.  Return 1 week for carpal tunnel sutures removal.  He can wean himself out of the collar.  X-rays today show good position and alignment of the disc arthroplasty.  Follow-Up Instructions: Return in about 1 week (around 08/16/2018).   Orders:  Orders Placed This Encounter  Procedures  . XR Cervical Spine 2 or 3 views   No orders of the defined types were placed in this encounter.   Imaging: Xr Cervical Spine 2 Or 3 Views  Result Date: 08/09/2018 2 view x-rays cervical spine shows C3-4 cervical disc arthroplasty in good position unchanged from Intra-Op fluoroscopic photos. Impression: C3-4 disc arthroplasty satisfactory position.   PMFS History: Patient Active Problem List   Diagnosis Date Noted  . HNP (herniated nucleus pulposus), cervical 08/02/2018  . Spinal stenosis of cervical region 07/21/2018  . Other cervical disc degeneration, unspecified cervical region 07/21/2018  . Carpal tunnel syndrome, left upper limb 07/21/2018  . Cervical disc herniation 07/21/2018   Past Medical History:  Diagnosis Date  . Acid reflux   . Sciatica     Family History  Adopted: Yes    Past Surgical History:  Procedure Laterality Date  . CARPAL TUNNEL RELEASE Left 08/02/2018   Procedure: LEFT CARPAL TUNNEL RELEASE (2nd);  Surgeon: Eldred MangesYates, Loribeth Katich C, MD;  [facility];  Service: Orthopedics;   Laterality: Left;  . CERVICAL DISC ARTHROPLASTY N/A 08/02/2018   Procedure: C3-4 CERVICAL DISC ARTHROPLASTY (1st);  Surgeon: Eldred MangesYates, Kolin Erdahl C, MD;  [facility];  Service: Orthopedics;  Laterality: N/A;  . TONSILLECTOMY  1985   Social History   Occupational History  . Not on file  Tobacco Use  . Smoking status: Former Smoker    Last attempt to quit: 06/27/2017    Years since quitting: 1.1  . Smokeless tobacco: Never Used  Substance and Sexual Activity  . Alcohol use: Yes    Alcohol/week: 8.0 standard drinks    Types: 8 Shots of liquor per week    Comment: social drinking on weekend  . Drug use: Yes    Types: Marijuana  . Sexual activity: Not Currently

## 2018-08-16 ENCOUNTER — Encounter (INDEPENDENT_AMBULATORY_CARE_PROVIDER_SITE_OTHER): Payer: Self-pay | Admitting: Orthopaedic Surgery

## 2018-08-16 ENCOUNTER — Ambulatory Visit (INDEPENDENT_AMBULATORY_CARE_PROVIDER_SITE_OTHER): Payer: BLUE CROSS/BLUE SHIELD | Admitting: Orthopaedic Surgery

## 2018-08-16 VITALS — BP 148/96 | HR 78 | Ht 71.0 in | Wt 251.0 lb

## 2018-08-16 DIAGNOSIS — Z9889 Other specified postprocedural states: Secondary | ICD-10-CM

## 2018-08-16 NOTE — Progress Notes (Signed)
   Post-Op Visit Note   Patient: Paul Huff           Date of Birth: 27-Dec-1970           MRN: [id number] Visit Date: 08/16/2018 PCP: Shirline FreesNafziger, Cory, NP   Assessment & Plan:  Chief Complaint:  Chief Complaint  Patient presents with  . Neck - Follow-up    08/02/18 C3-4 Cervical Disc Arthroplasty, Left CTR  . Left Wrist - Follow-up    08/02/18 C3-4 Cervical Disc Arthroplasty, Left CTR   Visit Diagnoses:  1. S/P cervical disc replacement             Post left carpal tunnel release.  Plan: Sutures removed from left carpal tunnel which is healing nicely.  He is feeling better after his disc arthroplasty.  He can use his collar intermittently I will check him in 4 weeks for repeat C-spine AP lateral x-rays and then we can discuss setting a date for work resumption.  Follow-Up Instructions: No follow-ups on file.   Orders:  No orders of the defined types were placed in this encounter.  No orders of the defined types were placed in this encounter.   Imaging: No results found.  PMFS History: Patient Active Problem List   Diagnosis Date Noted  . S/P cervical disc replacement 08/09/2018  . HNP (herniated nucleus pulposus), cervical 08/02/2018  . Spinal stenosis of cervical region 07/21/2018  . Other cervical disc degeneration, unspecified cervical region 07/21/2018  . Carpal tunnel syndrome, left upper limb 07/21/2018  . Cervical disc herniation 07/21/2018   Past Medical History:  Diagnosis Date  . Acid reflux   . Sciatica     Family History  Adopted: Yes    Past Surgical History:  Procedure Laterality Date  . CARPAL TUNNEL RELEASE Left 08/02/2018   Procedure: LEFT CARPAL TUNNEL RELEASE (2nd);  Surgeon: Eldred MangesYates, Priscille Shadduck C, MD;  [facility];  Service: Orthopedics;  Laterality: Left;  . CERVICAL DISC ARTHROPLASTY N/A 08/02/2018   Procedure: C3-4 CERVICAL DISC ARTHROPLASTY (1st);  Surgeon: Eldred MangesYates, Rolan Wrightsman C, MD;  [facility];  Service: Orthopedics;  Laterality: N/A;   . TONSILLECTOMY  1985   Social History   Occupational History  . Not on file  Tobacco Use  . Smoking status: Former Smoker    Last attempt to quit: 06/27/2017    Years since quitting: 1.1  . Smokeless tobacco: Never Used  Substance and Sexual Activity  . Alcohol use: Yes    Alcohol/week: 8.0 standard drinks    Types: 8 Shots of liquor per week    Comment: social drinking on weekend  . Drug use: Yes    Types: Marijuana  . Sexual activity: Not Currently

## 2018-09-13 ENCOUNTER — Ambulatory Visit (INDEPENDENT_AMBULATORY_CARE_PROVIDER_SITE_OTHER): Payer: BLUE CROSS/BLUE SHIELD | Admitting: Orthopaedic Surgery

## 2018-09-13 ENCOUNTER — Encounter (INDEPENDENT_AMBULATORY_CARE_PROVIDER_SITE_OTHER): Payer: Self-pay | Admitting: Orthopaedic Surgery

## 2018-09-13 ENCOUNTER — Ambulatory Visit (INDEPENDENT_AMBULATORY_CARE_PROVIDER_SITE_OTHER): Payer: BLUE CROSS/BLUE SHIELD

## 2018-09-13 VITALS — BP 143/99 | HR 87 | Ht 71.0 in | Wt 251.0 lb

## 2018-09-13 DIAGNOSIS — Z9889 Other specified postprocedural states: Secondary | ICD-10-CM

## 2018-09-13 NOTE — Progress Notes (Signed)
   Post-Op Visit Note   Patient: Paul Huff           Date of Birth: 05/02/1971           MRN: [id number] Visit Date: 09/13/2018 PCP: Shirline FreesNafziger, Cory, NP   Assessment & Plan: Postop C3-4 disc arthroplasty and left carpal tunnel release.  He is happy with the results of surgery good relief of preop pain.  Work slip given for work resumption on 10/02/2018.  Chief Complaint:  Chief Complaint  Patient presents with  . Neck - Follow-up  . Left Wrist - Follow-up   Visit Diagnoses:  1. S/P cervical disc replacement     Plan: 2 view x-ray showed good position of the disc arthroplasty at C3-4.  He is happy with the surgical result and can resume work on 10/02/2018.  He plans on returning in the spring to get his lumbar spine evaluated.  Tunnel incision looks good he has good finger range of motion.  Follow-Up Instructions: No follow-ups on file.   Orders:  Orders Placed This Encounter  Procedures  . XR Cervical Spine 2 or 3 views   No orders of the defined types were placed in this encounter.   Imaging: No results found.  PMFS History: Patient Active Problem List   Diagnosis Date Noted  . S/P cervical disc replacement 08/09/2018  . HNP (herniated nucleus pulposus), cervical 08/02/2018  . Spinal stenosis of cervical region 07/21/2018  . Other cervical disc degeneration, unspecified cervical region 07/21/2018  . Carpal tunnel syndrome, left upper limb 07/21/2018  . Cervical disc herniation 07/21/2018   Past Medical History:  Diagnosis Date  . Acid reflux   . Sciatica     Family History  Adopted: Yes    Past Surgical History:  Procedure Laterality Date  . CARPAL TUNNEL RELEASE Left 08/02/2018   Procedure: LEFT CARPAL TUNNEL RELEASE (2nd);  Surgeon: Eldred MangesYates, Mark C, MD;  [facility];  Service: Orthopedics;  Laterality: Left;  . CERVICAL DISC ARTHROPLASTY N/A 08/02/2018   Procedure: C3-4 CERVICAL DISC ARTHROPLASTY (1st);  Surgeon: Eldred MangesYates, Mark C, MD;  [facility];  Service: Orthopedics;  Laterality: N/A;  . TONSILLECTOMY  1985   Social History   Occupational History  . Not on file  Tobacco Use  . Smoking status: Former Smoker    Last attempt to quit: 06/27/2017    Years since quitting: 1.2  . Smokeless tobacco: Never Used  Substance and Sexual Activity  . Alcohol use: Yes    Alcohol/week: 8.0 standard drinks    Types: 8 Shots of liquor per week    Comment: social drinking on weekend  . Drug use: Yes    Types: Marijuana  . Sexual activity: Not Currently

## 2018-11-30 ENCOUNTER — Ambulatory Visit (INDEPENDENT_AMBULATORY_CARE_PROVIDER_SITE_OTHER): Payer: BLUE CROSS/BLUE SHIELD | Admitting: Adult Health

## 2018-11-30 ENCOUNTER — Encounter: Payer: Self-pay | Admitting: Adult Health

## 2018-11-30 VITALS — BP 130/92 | Temp 97.9°F | Ht 69.75 in | Wt 250.0 lb

## 2018-11-30 DIAGNOSIS — M5441 Lumbago with sciatica, right side: Secondary | ICD-10-CM

## 2018-11-30 DIAGNOSIS — Z0000 Encounter for general adult medical examination without abnormal findings: Secondary | ICD-10-CM | POA: Diagnosis not present

## 2018-11-30 DIAGNOSIS — Z125 Encounter for screening for malignant neoplasm of prostate: Secondary | ICD-10-CM

## 2018-11-30 LAB — LIPID PANEL
CHOL/HDL RATIO: 6
Cholesterol: 189 mg/dL (ref 0–200)
HDL: 33.3 mg/dL — ABNORMAL LOW (ref >39.00)
LDL Cholesterol: 135 mg/dL — ABNORMAL HIGH (ref 0–99)
NONHDL: 155.5
Triglycerides: 102 mg/dL (ref 0.0–149.0)
VLDL: 20.4 mg/dL (ref 0.0–40.0)

## 2018-11-30 LAB — COMPREHENSIVE METABOLIC PANEL
ALT: 17 U/L (ref 0–53)
AST: 15 U/L (ref 0–37)
Albumin: 4.5 g/dL (ref 3.5–5.2)
Alkaline Phosphatase: 61 U/L (ref 39–117)
BILIRUBIN TOTAL: 0.7 mg/dL (ref 0.2–1.2)
BUN: 14 mg/dL (ref 6–23)
CALCIUM: 9.3 mg/dL (ref 8.4–10.5)
CO2: 28 meq/L (ref 19–32)
CREATININE: 0.95 mg/dL (ref 0.40–1.50)
Chloride: 104 mEq/L (ref 96–112)
GFR: 84.69 mL/min (ref >60.00)
GLUCOSE: 81 mg/dL (ref 70–99)
Potassium: 4.5 mEq/L (ref 3.5–5.1)
Sodium: 139 mEq/L (ref 135–145)
Total Protein: 6.7 g/dL (ref 6.0–8.3)

## 2018-11-30 LAB — PSA: PSA: 0.45 ng/mL (ref 0.10–4.00)

## 2018-11-30 LAB — CBC WITH DIFFERENTIAL/PLATELET
BASOS ABS: 0.1 10*3/uL (ref 0.0–0.1)
Basophils Relative: 0.8 % (ref 0.0–3.0)
EOS ABS: 0.3 10*3/uL (ref 0.0–0.7)
Eosinophils Relative: 2.6 % (ref 0.0–5.0)
HEMATOCRIT: 46.3 % (ref 39.0–52.0)
Hemoglobin: 15.5 g/dL (ref 13.0–17.0)
LYMPHS PCT: 28.1 % (ref 12.0–46.0)
Lymphs Abs: 3.2 10*3/uL (ref 0.7–4.0)
MCHC: 33.5 g/dL (ref 30.0–36.0)
MCV: 85.5 fl (ref 78.0–100.0)
Monocytes Absolute: 1 10*3/uL (ref 0.1–1.0)
Monocytes Relative: 8.7 % (ref 3.0–12.0)
NEUTROS ABS: 6.7 10*3/uL (ref 1.4–7.7)
NEUTROS PCT: 59.8 % (ref 43.0–77.0)
Platelets: 300 10*3/uL (ref 150.0–400.0)
RBC: 5.41 Mil/uL (ref 4.22–5.81)
RDW: 13.6 % (ref 11.5–15.5)
WBC: 11.3 10*3/uL — ABNORMAL HIGH (ref 4.0–10.5)

## 2018-11-30 LAB — TSH: TSH: 0.42 u[IU]/mL (ref 0.35–4.50)

## 2018-11-30 MED ORDER — IBUPROFEN 800 MG PO TABS: 800.0000 mg | ORAL_TABLET | Freq: Three times a day (TID) | ORAL | 1 refills | Status: AC | PRN

## 2018-11-30 NOTE — Progress Notes (Signed)
Subjective:    Patient ID: Paul Huff, male    DOB: 03/24/1971, 48 y.o.   MRN: [id number]  HPI Patient presents for yearly preventative medicine examination. He is a pleasant 48 year old male who  has a past medical history of Acid reflux and Sciatica.  In December 2019 an elective surgery of C3/4 disc arthroplasty and left carpal tunnel release.  This was done by Dr. Ophelia Charter.  Patient reports he is happy with the results and has had no postop problems. He will be following up with Dr. Ophelia Charter for chronic low back pain with right sided sciatica.   All immunizations and health maintenance protocols were reviewed with the patient and needed orders were placed. Vaccinations UTD  Appropriate screening laboratory values were ordered for the patient including screening of hyperlipidemia, renal function and hepatic function. If indicated by BPH, a PSA was ordered.  Medication reconciliation,  past medical history, social history, problem list and allergies were reviewed in detail with the patient  Goals were established with regard to weight loss, exercise, and  diet in compliance with medications. He finds if difficult to exercise do to lower back pain. His diet has been suffering but he has been able to lose 20 pounds over the last year  Wt Readings from Last 3 Encounters:  11/30/18 250 lb (113.4 kg)  09/13/18 251 lb (113.9 kg)  08/16/18 251 lb (113.9 kg)   He participates in routine dental and vision screens.  Review of Systems  Constitutional: Negative.   HENT: Negative.   Eyes: Negative.   Respiratory: Negative.   Cardiovascular: Negative.   Gastrointestinal: Negative.   Endocrine: Negative.   Genitourinary: Negative.   Musculoskeletal: Positive for back pain.  Skin: Negative.   Allergic/Immunologic: Negative.   Neurological: Negative.   Hematological: Negative.   Psychiatric/Behavioral: Negative.   All other systems reviewed and are negative.  Past Medical History:    Diagnosis Date  . Acid reflux   . Sciatica     Social History   Socioeconomic History  . Marital status: Married    Spouse name: Not on file  . Number of children: Not on file  . Years of education: Not on file  . Highest education level: Not on file  Occupational History  . Not on file  Social Needs  . Financial resource strain: Not on file  . Food insecurity:    Worry: Not on file    Inability: Not on file  . Transportation needs:    Medical: Not on file    Non-medical: Not on file  Tobacco Use  . Smoking status: Former Smoker    Last attempt to quit: 06/27/2017    Years since quitting: 1.4  . Smokeless tobacco: Never Used  Substance and Sexual Activity  . Alcohol use: Yes    Alcohol/week: 8.0 standard drinks    Types: 8 Shots of liquor per week    Comment: social drinking on weekend  . Drug use: Yes    Types: Marijuana  . Sexual activity: Not Currently  Lifestyle  . Physical activity:    Days per week: Not on file    Minutes per session: Not on file  . Stress: Not on file  Relationships  . Social connections:    Talks on phone: Not on file    Gets together: Not on file    Attends religious service: Not on file    Active member of club or organization: Not on file  Attends meetings of clubs or organizations: Not on file    Relationship status: Not on file  . Intimate partner violence:    Fear of current or ex partner: Not on file    Emotionally abused: Not on file    Physically abused: Not on file    Forced sexual activity: Not on file  Other Topics Concern  . Not on file  Social History Narrative   Administrator, [employer] Service    Married    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     Past Surgical History:  Procedure Laterality Date  . CARPAL TUNNEL RELEASE Left 08/02/2018   Procedure: LEFT CARPAL TUNNEL RELEASE (2nd);  Surgeon: [name], MD;  Location: [facility] OR;  Service: Orthopedics;  Laterality: Left;  . CERVICAL DISC  ARTHROPLASTY N/A 08/02/2018   Procedure: C3-4 CERVICAL DISC ARTHROPLASTY (1st);  Surgeon: [name], MD;  Location: [facility] OR;  Service: Orthopedics;  Laterality: N/A;  . TONSILLECTOMY  1985    Family History  Adopted: Yes    No Known Allergies  Current Outpatient Medications on File Prior to Visit  Medication Sig Dispense Refill  . oxyCODONE-acetaminophen (PERCOCET/ROXICET) 5-325 MG tablet Take 1-2 tablets by mouth every 6 (six) hours as needed for severe pain. (Patient not taking: Reported on 11/30/2018) 50 tablet 0   No current facility-administered medications on file prior to visit.     BP (!) 130/92   Temp 97.9 F (36.6 C)   Ht 5' 9.75" (1.772 m)   Wt 250 lb (113.4 kg)   BMI 36.13 kg/m        Objective:   Physical Exam Vitals signs and nursing note reviewed.  Constitutional:      General: He is not in acute distress.    Appearance: He is well-developed. He is obese. He is not diaphoretic.  HENT:     Head: Normocephalic and atraumatic.     Right Ear: Tympanic membrane, ear canal and external ear normal. There is no impacted cerumen.     Left Ear: Tympanic membrane, ear canal and external ear normal. There is no impacted cerumen.     Nose: Nose normal. No congestion or rhinorrhea.     Mouth/Throat:     Mouth: Mucous membranes are moist.     Pharynx: Oropharynx is clear. No oropharyngeal exudate or posterior oropharyngeal erythema.  Eyes:     General:        Right eye: No discharge.        Left eye: No discharge.     Extraocular Movements: Extraocular movements intact.     Conjunctiva/sclera: Conjunctivae normal.     Pupils: Pupils are equal, round, and reactive to light.  Neck:     Thyroid: No thyromegaly.     Trachea: No tracheal deviation.  Cardiovascular:     Rate and Rhythm: Normal rate and regular rhythm.     Pulses: Normal pulses.     Heart sounds: Normal heart sounds. No murmur. No friction rub. No gallop.   Pulmonary:     Effort: Pulmonary effort is  normal. No respiratory distress.     Breath sounds: Normal breath sounds. No stridor. No wheezing, rhonchi or rales.  Chest:     Chest wall: No tenderness.  Abdominal:     General: Bowel sounds are normal. There is no distension.     Palpations: Abdomen is soft. There is no mass.  Tenderness: There is no abdominal tenderness. There is no right CVA tenderness, left CVA tenderness, guarding or rebound.     Hernia: No hernia is present.  Musculoskeletal: Normal range of motion.        General: Tenderness (right lower back ) present. No swelling, deformity or signs of injury.     Right lower leg: No edema.     Left lower leg: No edema.  Lymphadenopathy:     Cervical: No cervical adenopathy.  Skin:    General: Skin is warm and dry.     Capillary Refill: Capillary refill takes less than 2 seconds.     Coloration: Skin is not jaundiced or pale.     Findings: No bruising, erythema, lesion or rash.  Neurological:     Mental Status: He is alert and oriented to person, place, and time.     Cranial Nerves: No cranial nerve deficit.     Motor: No weakness.     Coordination: Coordination normal.     Gait: Gait normal.     Deep Tendon Reflexes: Reflexes normal.  Psychiatric:        Mood and Affect: Mood normal.        Behavior: Behavior normal.        Thought Content: Thought content normal.        Judgment: Judgment normal.       Assessment & Plan:  1. Routine general medical examination at a health care facility - Work on lifestyle modifications  - CBC with Differential/Platelet - Comprehensive metabolic panel - Lipid panel - TSH  2. Prostate cancer screening  - PSA  3. Acute right-sided low back pain with right-sided sciatica - Follow up with Orthopedics  - ibuprofen (ADVIL,MOTRIN) 800 MG tablet; Take 1 tablet (800 mg total) by mouth every 8 (eight) hours as needed.  Dispense: 90 tablet; Refill: 1  Shirline Frees, NP

## 2019-02-04 IMAGING — RF DG C-ARM 61-120 MIN
1 series · 2 of 2 positions shown · non-contrast
Comparison: MRI 03/31/2018

CLINICAL DATA: C3-4 disc arthroplasty

EXAM:
DG C-ARM 61-120 MIN; CERVICAL SPINE - 2-3 VIEW

[Series 1: run · 2 of 2 slices shown]
[im 1/2]
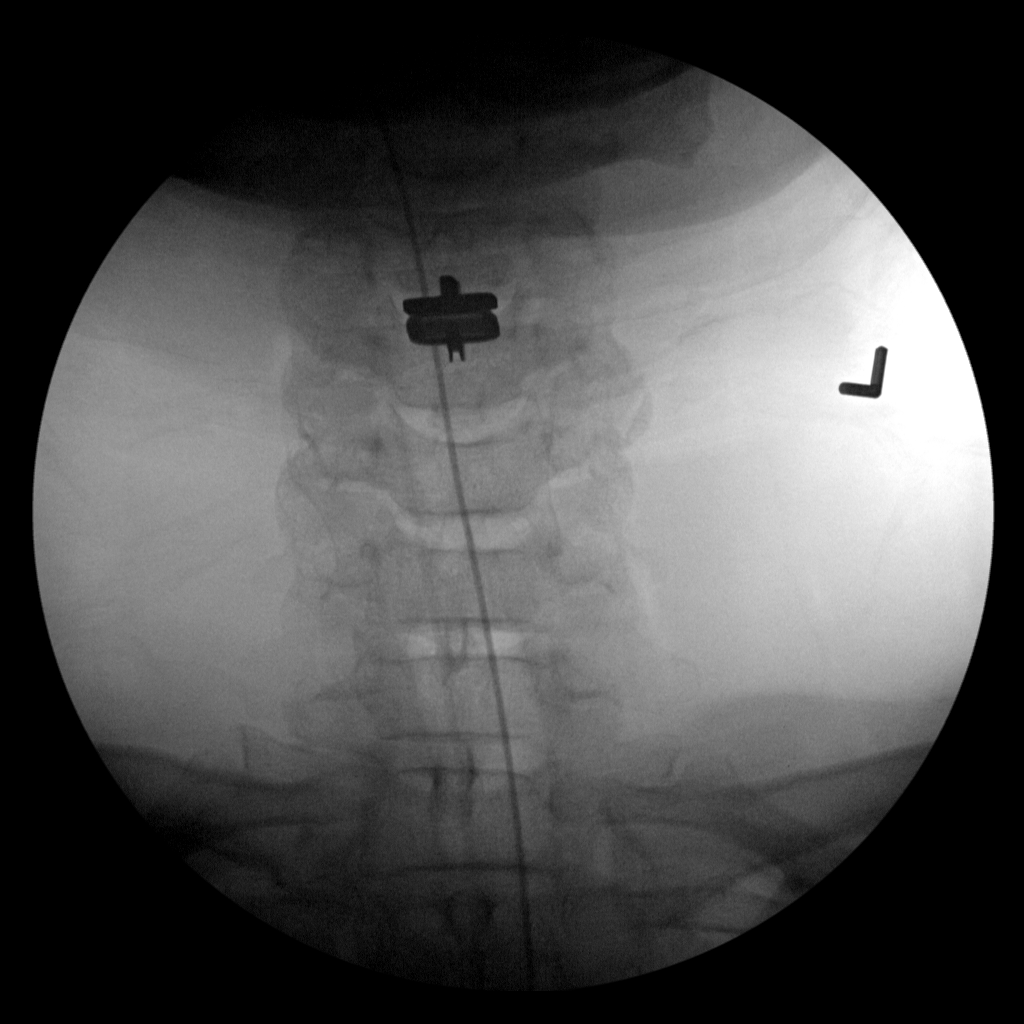
[im 2/2]
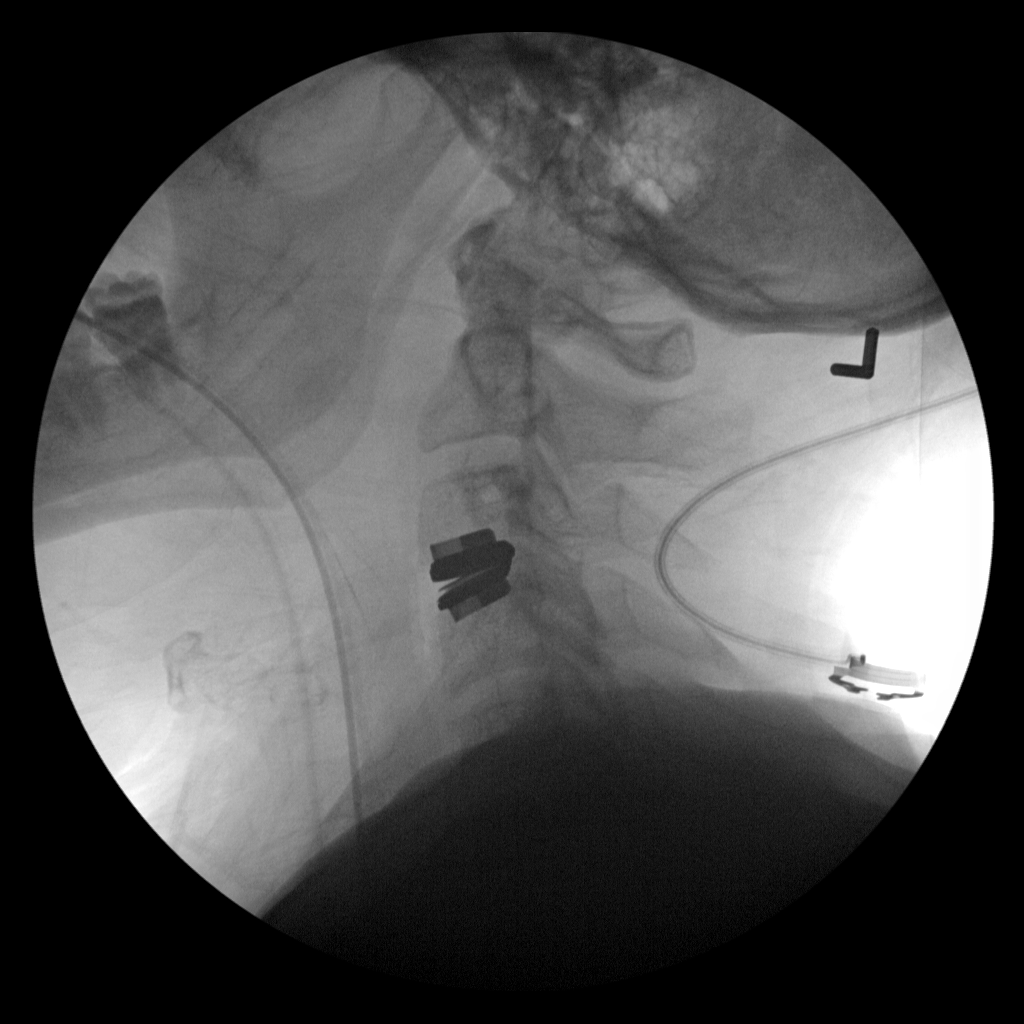

[2 of 2 positions shown; findings below may reference images not displayed]

FINDINGS: Two low resolution intraoperative spot views of the cervical spine.
Total fluoroscopy time was 33 seconds. Interbody device at C3-C4.
IMPRESSION: Intraoperative fluoroscopic assistance provided during cervical
spine surgery

## 2019-03-14 ENCOUNTER — Ambulatory Visit (INDEPENDENT_AMBULATORY_CARE_PROVIDER_SITE_OTHER): Payer: BC Managed Care – PPO

## 2019-03-14 ENCOUNTER — Other Ambulatory Visit: Payer: Self-pay

## 2019-03-14 ENCOUNTER — Ambulatory Visit (INDEPENDENT_AMBULATORY_CARE_PROVIDER_SITE_OTHER): Payer: BC Managed Care – PPO | Admitting: Orthopaedic Surgery

## 2019-03-14 ENCOUNTER — Encounter: Payer: Self-pay | Admitting: Orthopaedic Surgery

## 2019-03-14 VITALS — Ht 71.0 in | Wt 235.0 lb

## 2019-03-14 DIAGNOSIS — M545 Low back pain, unspecified: Secondary | ICD-10-CM

## 2019-03-14 DIAGNOSIS — G8929 Other chronic pain: Secondary | ICD-10-CM

## 2019-03-14 MED ORDER — TRAMADOL HCL 50 MG PO TABS: 50.0000 mg | ORAL_TABLET | Freq: Two times a day (BID) | ORAL | 0 refills | Status: DC | PRN

## 2019-03-14 NOTE — Addendum Note (Signed)
Addended by: Meyer Cory on: 03/14/2019 11:18 AM   Modules accepted: Orders

## 2019-03-14 NOTE — Progress Notes (Signed)
Office Visit Note   Patient: Paul Huff           Date of Birth: 1971-01-27           MRN: [id number] Visit Date: 03/14/2019              Requested by: Shirline FreesNafziger, Cory, NP [address] [place] [id number] PCP: Shirline FreesNafziger, Cory, NP   Assessment & Plan: Visit Diagnoses:  1. Chronic right-sided low back pain, unspecified whether sciatica present     Plan: Patient's had 3 months conservative treatment with a walking program stretching program ibuprofen 800 mg p.o. twice daily.  This pain is gradually increased upon retaking some leftover Norco tablets.  X-rays demonstrate progressive disc degeneration in the lumbar spine.  I would recommend proceeding with an MRI scan lumbar spine for evaluation of stenosis at L4-5 L5S1.  Office follow-up after scan for review.  Follow-Up Instructions: after MRI  Orders:  Orders Placed This Encounter  Procedures   XR Lumbar Spine 2-3 Views   Meds ordered this encounter  Medications   traMADol (ULTRAM) 50 MG tablet    Sig: Take 1 tablet (50 mg total) by mouth every 12 (twelve) hours as needed.    Dispense:  30 tablet    Refill:  0      Procedures: No procedures performed   Clinical Data: No additional findings.   Subjective: Chief Complaint  Patient presents with   Lower Back - Pain    HPI 48 year old male with problems with chronic back pain for greater than a year.  He had C3-4 cervical disc arthroplasty and left carpal tunnel release by me in November 2019 which is doing well.  He has had gradual increasing back pain that radiates from his back into his legs down his right leg with some pain in his testicle numbness down to his toe and pain with standing and walking.  He states pain progressed to the point where he was taking some hydrocodone that he had leftover from surgery to get some relief.  He was placed on a walking program and on ibuprofen 800 mg on 11/30/2018 by Shirline Freesory Nafziger NP.  Patient states  initially the ibuprofen helped some but is not helping at all in his back pain keeps him awake it hard for him to sit stand walk or lay down.  He has no pain in his neck no numbness in his hands.  Denies fever chills no bowel bladder associated symptoms.  Review of Systems 14 for review of systems positive for carpal tunnel in the left surgically treated.  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  Negative for hypertension chest pain negative pulmonary.  14 point systems otherwise noncontributory as pertains HPI.   Objective: Vital Signs: Ht 5\' 11"  (1.803 m)    Wt 235 lb (106.6 kg)    BMI 32.78 kg/m   Physical Exam Constitutional:      Appearance: He is well-developed.  HENT:     Head: Normocephalic and atraumatic.  Eyes:     Pupils: Pupils are equal, round, and reactive to light.  Neck:     Thyroid: No thyromegaly.     Trachea: No tracheal deviation.  Cardiovascular:     Rate and Rhythm: Normal rate.  Pulmonary:     Effort: Pulmonary effort is normal.     Breath sounds: No wheezing.  Abdominal:     General: Bowel sounds are normal.     Palpations: Abdomen is soft.  Skin:    General: Skin is warm and dry.     Capillary Refill: Capillary refill takes less than 2 seconds.  Neurological:     Mental Status: He is alert and oriented to person, place, and time.  Psychiatric:        Behavior: Behavior normal.        Thought Content: Thought content normal.        Judgment: Judgment normal.     Ortho Exam patient is able to heel and toe walk.  Knee and ankle jerk are 1+ and symmetrical.  Negative logroll to the hips.  He can dorsiflex plantarflex with good strength.  No sensory deficit.  No trochanteric bursal tenderness.  No sciatic notch tenderness.  Specialty Comments:  No specialty comments available.  Imaging: Xr Lumbar Spine 2-3 Views  Result Date: 03/14/2019 AP lateral lumbar spine x-rays are obtained and reviewed.  Comparison /2018 lumbar radiographs.   Patient has grade for percent narrowing L4-5 L5-S1 disc spaces with anterior posterior spurring at L4-5.  Facet arthropathy.  Negative for compression fractures. Impression: Lumbar disc degeneration with loss of disc space height at L4-5 L5-S1 which is progressed from 2018 images.    PMFS History: Patient Active Problem List   Diagnosis Date Noted   S/P cervical disc replacement 08/09/2018   HNP (herniated nucleus pulposus), cervical 08/02/2018   Spinal stenosis of cervical region 07/21/2018   Other cervical disc degeneration, unspecified cervical region 07/21/2018   Carpal tunnel syndrome, left upper limb 07/21/2018   Cervical disc herniation 07/21/2018   Past Medical History:  Diagnosis Date   Acid reflux    Sciatica     Family History  Adopted: Yes    Past Surgical History:  Procedure Laterality Date   CARPAL TUNNEL RELEASE Left 08/02/2018   Procedure: LEFT CARPAL TUNNEL RELEASE (2nd);  Surgeon: [name], MD;  Location: [facility];  Service: Orthopedics;  Laterality: Left;   CERVICAL DISC ARTHROPLASTY N/A 08/02/2018   Procedure: C3-4 CERVICAL DISC ARTHROPLASTY (1st);  Surgeon: [name], MD;  Location: [facility];  Service: Orthopedics;  Laterality: N/A;   TONSILLECTOMY  1985   Social History   Occupational History   Not on file  Tobacco Use   Smoking status: Former Smoker    Quit date: 06/27/2017    Years since quitting: 1.7   Smokeless tobacco: Never Used  Substance and Sexual Activity   Alcohol use: Yes    Alcohol/week: 8.0 standard drinks    Types: 8 Shots of liquor per week    Comment: social drinking on weekend   Drug use: Yes    Types: Marijuana   Sexual activity: Not Currently

## 2019-03-28 ENCOUNTER — Ambulatory Visit: Payer: BC Managed Care – PPO | Admitting: Orthopaedic Surgery

## 2019-04-07 ENCOUNTER — Ambulatory Visit
Admission: RE | Admit: 2019-04-07 | Discharge: 2019-04-07 | Disposition: A | Discharge status: Home / Self Care | Payer: BC Managed Care – PPO | Source: Ambulatory Visit | Attending: Orthopaedic Surgery | Admitting: Orthopaedic Surgery

## 2019-04-07 ENCOUNTER — Other Ambulatory Visit: Payer: Self-pay

## 2019-04-07 DIAGNOSIS — G8929 Other chronic pain: Secondary | ICD-10-CM

## 2019-04-07 DIAGNOSIS — M545 Low back pain, unspecified: Secondary | ICD-10-CM

## 2019-04-09 ENCOUNTER — Other Ambulatory Visit: Payer: Self-pay | Admitting: Adult Health

## 2019-04-11 ENCOUNTER — Other Ambulatory Visit: Payer: Self-pay

## 2019-04-11 ENCOUNTER — Encounter: Payer: Self-pay | Admitting: Orthopaedic Surgery

## 2019-04-11 ENCOUNTER — Ambulatory Visit (INDEPENDENT_AMBULATORY_CARE_PROVIDER_SITE_OTHER): Payer: BC Managed Care – PPO | Admitting: Orthopaedic Surgery

## 2019-04-11 DIAGNOSIS — M5126 Other intervertebral disc displacement, lumbar region: Secondary | ICD-10-CM | POA: Insufficient documentation

## 2019-04-11 NOTE — Telephone Encounter (Signed)
DENIED.  PT IS SEEING DR. MARK YATES AND HE IS HANDLING PAIN MANAGEMENT.  NOTHING FURTHER NEEDED.

## 2019-04-11 NOTE — Addendum Note (Signed)
Addended by: Michae Kava B on: 04/11/2019 10:23 AM   Modules accepted: Orders

## 2019-04-11 NOTE — Progress Notes (Signed)
Office Visit Note   Patient: Paul Huff           Date of Birth: 06-11-1971           MRN: [id number] Visit Date: 04/11/2019              Requested by: Dorothyann Peng, NP [place],  [place] [address] PCP: Dorothyann Peng, NP   Assessment & Plan: Visit Diagnoses:  1. Protrusion of lumbar intervertebral disc        Right L4-5  Plan: Patient's MRI is reviewed with patient images I gave him a copy of the report.  He has a prominent paracentral protrusion at L4-5 with right lateral recess compression.  Disc degeneration at L5-S1 without compression.  Levels above L4-5 were all normal.  We will set him up for single epidural injection I will recheck him in a month.  We discussed possible surgical options if we cannot settle down his symptoms due to the amount of pain and radicular symptoms he is having on the right side from L5 nerve root compression.  Office follow-up after epidural.  Pathophysiology discussed.  Follow-Up Instructions: No follow-ups on file.   Orders:  No orders of the defined types were placed in this encounter.  No orders of the defined types were placed in this encounter.     Procedures: No procedures performed   Clinical Data: No additional findings.   Subjective: Chief Complaint  Patient presents with  . Lower Back - Pain    HPI 48 year old male returns with ongoing problems with chronic back pain is been going on for multiple years.  He has been treated with ibuprofen in the past also a walking program with gradual progression of pain at times he had to take some Norco pain medication to handle the pain.  MRI scan is been obtained and is available for review.  Recently has been taking tramadol for the pain.  He denies chills or fever no bowel or bladder symptoms.  Pain radiates from his back and his buttocks down to his leg and down the dorsum of the foot.  Review of Systems 14 point systems updated.  Previous carpal tunnel  surgery, cervical disc arthroplasty at the C3-4 level doing well.  Otherwise negative is obtains HPI.   Objective: Vital Signs: There were no vitals taken for this visit.  Physical Exam Constitutional:      Appearance: He is well-developed.  HENT:     Head: Normocephalic and atraumatic.  Eyes:     Pupils: Pupils are equal, round, and reactive to light.  Neck:     Thyroid: No thyromegaly.     Trachea: No tracheal deviation.  Cardiovascular:     Rate and Rhythm: Normal rate.  Pulmonary:     Effort: Pulmonary effort is normal.     Breath sounds: No wheezing.  Abdominal:     General: Bowel sounds are normal.     Palpations: Abdomen is soft.  Skin:    General: Skin is warm and dry.     Capillary Refill: Capillary refill takes less than 2 seconds.  Neurological:     Mental Status: He is alert and oriented to person, place, and time.  Psychiatric:        Behavior: Behavior normal.        Thought Content: Thought content normal.        Judgment: Judgment normal.     Ortho Exam patient is able to heel and toe  walk he has pain with straight leg raising on the right at 80 degrees.  Mild weakness EHL.  Gastrocsoleus is strong.  No calf or anterior compartment atrophy right or left.  Specialty Comments:  No specialty comments available.  Imaging: CLINICAL DATA:  Chronic low back pain radiating into the right groin and leg.  EXAM: MRI LUMBAR SPINE WITHOUT CONTRAST  TECHNIQUE: Multiplanar, multisequence MR imaging of the lumbar spine was performed. No intravenous contrast was administered.  COMPARISON:  Plain films lumbar spine [facility]14/2019 [facility]  FINDINGS: Segmentation:  Standard.  Alignment:  Trace retrolisthesis L4 on L5 noted.  Vertebrae: No fracture, evidence of discitis, or bone lesion. Degenerative endplate signal change is present at L4-5 and L5-S1.  Conus medullaris and cauda equina: Conus extends to the L1-2 level. Conus and cauda  equina appear normal.  Paraspinal and other soft tissues: Negative.  Disc levels:  T11-12: Minimal disc bulge.  No stenosis.  T12-L1: Negative.  L1-2: Negative.  L2-3: Negative.  L3-4: Negative.  L4-5: The patient has a broad-based right paracentral disc protrusion resulting in narrowing in the right lateral recess which could impact the descending L5 root. Neural foramina are open.  L5-S1: Shallow broad-based central protrusion without stenosis.  IMPRESSION: Broad-based right paracentral protrusion at L4-5 causes narrowing in the right lateral recess and could impact the right L5 root.  Shallow broad-based central protrusion L5-S1 without stenosis.   Electronically Signed   By: Drusilla Kannerhomas  Dalessio M.D.   On: 04/07/2019 11:55   PMFS History: Patient Active Problem List   Diagnosis Date Noted  . Protrusion of lumbar intervertebral disc 04/11/2019  . S/P cervical disc replacement 08/09/2018  . Carpal tunnel syndrome, left upper limb 07/21/2018  . Cervical disc herniation 07/21/2018   Past Medical History:  Diagnosis Date  . Acid reflux   . Sciatica     Family History  Adopted: Yes    Past Surgical History:  Procedure Laterality Date  . CARPAL TUNNEL RELEASE Left 08/02/2018   Procedure: LEFT CARPAL TUNNEL RELEASE (2nd);  Surgeon: Eldred MangesYates, Kayl Stogdill C, MD;  [facility];  Service: Orthopedics;  Laterality: Left;  . CERVICAL DISC ARTHROPLASTY N/A 08/02/2018   Procedure: C3-4 CERVICAL DISC ARTHROPLASTY (1st);  Surgeon: Eldred MangesYates, Kelicia Youtz C, MD;  [facility];  Service: Orthopedics;  Laterality: N/A;  . TONSILLECTOMY  1985   Social History   Occupational History  . Not on file  Tobacco Use  . Smoking status: Former Smoker    Quit date: 06/27/2017    Years since quitting: 1.7  . Smokeless tobacco: Never Used  Substance and Sexual Activity  . Alcohol use: Yes    Alcohol/week: 8.0 standard drinks    Types: 8 Shots of liquor per week    Comment: social  drinking on weekend  . Drug use: Yes    Types: Marijuana  . Sexual activity: Not Currently

## 2019-04-27 ENCOUNTER — Telehealth: Payer: Self-pay | Admitting: Adult Health

## 2019-04-27 NOTE — Telephone Encounter (Signed)
Medication: ibuprofen (ADVIL,MOTRIN) 800 MG tablet   Patient is requesting a refill of this medication.    Pharmacy:     [id number] - [place], [place] [phone] (Phone) [phone] (Fax)

## 2019-04-30 ENCOUNTER — Ambulatory Visit (INDEPENDENT_AMBULATORY_CARE_PROVIDER_SITE_OTHER): Payer: BC Managed Care – PPO | Admitting: Physical Medicine and Rehabilitation

## 2019-04-30 ENCOUNTER — Encounter: Payer: Self-pay | Admitting: Physical Medicine and Rehabilitation

## 2019-04-30 ENCOUNTER — Ambulatory Visit: Payer: Self-pay

## 2019-04-30 VITALS — BP 162/102 | HR 76

## 2019-04-30 DIAGNOSIS — M5416 Radiculopathy, lumbar region: Secondary | ICD-10-CM | POA: Diagnosis not present

## 2019-04-30 MED ORDER — BETAMETHASONE SOD PHOS & ACET 6 (3-3) MG/ML IJ SUSP
12.0000 mg | Freq: Once | INTRAMUSCULAR | Status: AC
  Administered 2019-04-30: 12 mg

## 2019-04-30 NOTE — Progress Notes (Signed)
Numeric Pain Rating Scale and Functional Assessment Average Pain 10   In the last MONTH (on 0-10 scale) has pain interfered with the following?  1. General activity like being  able to carry out your everyday physical activities such as walking, climbing stairs, carrying groceries, or moving a chair?  Rating(10)   +Driver, -BT, -Dye Allergies. 

## 2019-05-01 MED ORDER — IBUPROFEN 600 MG PO TABS: 600.0000 mg | ORAL_TABLET | Freq: Three times a day (TID) | ORAL | 0 refills | Status: DC | PRN

## 2019-05-01 NOTE — Procedures (Signed)
Lumbosacral Transforaminal Epidural Steroid Injection - Sub-Pedicular Approach with Fluoroscopic Guidance  Patient: Paul Huff      Date of Birth: 1971-03-18 MRN: [id number] PCP: Dorothyann Peng, NP      Visit Date: 04/30/2019   Universal Protocol:    Date/Time: 04/30/2019  Consent Given By: the patient  Position: PRONE  Additional Comments: Vital signs were monitored before and after the procedure. Patient was prepped and draped in the usual sterile fashion. The correct patient, procedure, and site was verified.   Injection Procedure Details:  Procedure Site One Meds Administered:  Meds ordered this encounter  Medications  . betamethasone acetate-betamethasone sodium phosphate (CELESTONE) injection 12 mg    Laterality: Right  Location/Site:  L5-S1  Needle size: 22 G  Needle type: Spinal  Needle Placement: Transforaminal  Findings:    -Comments: Excellent flow of contrast along the nerve and into the epidural space.  Procedure Details: After squaring off the end-plates to get a true AP view, the C-arm was positioned so that an oblique view of the foramen as noted above was visualized. The target area is just inferior to the "nose of the scotty dog" or sub pedicular. The soft tissues overlying this structure were infiltrated with 2-3 ml. of 1% Lidocaine without Epinephrine.  The spinal needle was inserted toward the target using a "trajectory" view along the fluoroscope beam.  Under AP and lateral visualization, the needle was advanced so it did not puncture dura and was located close the 6 O'Clock position of the pedical in AP tracterory. Biplanar projections were used to confirm position. Aspiration was confirmed to be negative for CSF and/or blood. A 1-2 ml. volume of Isovue-250 was injected and flow of contrast was noted at each level. Radiographs were obtained for documentation purposes.   After attaining the desired flow of contrast documented above, a  0.5 to 1.0 ml test dose of 0.25% Marcaine was injected into each respective transforaminal space.  The patient was observed for 90 seconds post injection.  After no sensory deficits were reported, and normal lower extremity motor function was noted,   the above injectate was administered so that equal amounts of the injectate were placed at each foramen (level) into the transforaminal epidural space.   Additional Comments:  The patient tolerated the procedure well Dressing: 2 x 2 sterile gauze and Band-Aid    Post-procedure details: Patient was observed during the procedure. Post-procedure instructions were reviewed.  Patient left the clinic in stable condition.

## 2019-05-01 NOTE — Progress Notes (Signed)
Paul ModenaShane Anthony Klutz - 48 y.o. male MRN [id number]  Date of birth: Apr 10, 1971  Office Visit Note: Visit Date: 04/30/2019 PCP: Shirline FreesNafziger, Cory, NP Referred by: Shirline FreesNafziger, Cory, NP  Subjective: Chief Complaint  Patient presents with  . Lower Back - Pain  . Right Thigh - Pain   HPI:  Paul Huff is a 48 y.o. male who comes in today At the request of Dr. Annell GreeningMark Yates for diagnostic and hopefully therapeutic right L5 transforaminal epidural steroid injection.  Patient has had off-and-on pain for years but has had many months now of right hip and leg pain that he rates as 10 out of 10.  He has pretty classic L5 distribution pain.  MRI of the lumbar spine shows disc herniation paracentrally at L4-5 to the right.  He is failed conservative care otherwise.  ROS Otherwise per HPI.  Assessment & Plan: Visit Diagnoses:  1. Lumbar radiculopathy     Plan: No additional findings.   Meds & Orders:  Meds ordered this encounter  Medications  . betamethasone acetate-betamethasone sodium phosphate (CELESTONE) injection 12 mg    Orders Placed This Encounter  Procedures  . XR C-ARM NO REPORT  . Epidural Steroid injection    Follow-up: No follow-ups on file.   Procedures: No procedures performed  Lumbosacral Transforaminal Epidural Steroid Injection - Sub-Pedicular Approach with Fluoroscopic Guidance  Patient: Paul Huff      Date of Birth: Apr 10, 1971 MRN: [id number] PCP: Shirline FreesNafziger, Cory, NP      Visit Date: 04/30/2019   Universal Protocol:    Date/Time: 04/30/2019  Consent Given By: the patient  Position: PRONE  Additional Comments: Vital signs were monitored before and after the procedure. Patient was prepped and draped in the usual sterile fashion. The correct patient, procedure, and site was verified.   Injection Procedure Details:  Procedure Site One Meds Administered:  Meds ordered this encounter  Medications  . betamethasone acetate-betamethasone  sodium phosphate (CELESTONE) injection 12 mg    Laterality: Right  Location/Site:  L5-S1  Needle size: 22 G  Needle type: Spinal  Needle Placement: Transforaminal  Findings:    -Comments: Excellent flow of contrast along the nerve and into the epidural space.  Procedure Details: After squaring off the end-plates to get a true AP view, the C-arm was positioned so that an oblique view of the foramen as noted above was visualized. The target area is just inferior to the "nose of the scotty dog" or sub pedicular. The soft tissues overlying this structure were infiltrated with 2-3 ml. of 1% Lidocaine without Epinephrine.  The spinal needle was inserted toward the target using a "trajectory" view along the fluoroscope beam.  Under AP and lateral visualization, the needle was advanced so it did not puncture dura and was located close the 6 O'Clock position of the pedical in AP tracterory. Biplanar projections were used to confirm position. Aspiration was confirmed to be negative for CSF and/or blood. A 1-2 ml. volume of Isovue-250 was injected and flow of contrast was noted at each level. Radiographs were obtained for documentation purposes.   After attaining the desired flow of contrast documented above, a 0.5 to 1.0 ml test dose of 0.25% Marcaine was injected into each respective transforaminal space.  The patient was observed for 90 seconds post injection.  After no sensory deficits were reported, and normal lower extremity motor function was noted,   the above injectate was administered so that equal amounts of the injectate were placed at each  foramen (level) into the transforaminal epidural space.   Additional Comments:  The patient tolerated the procedure well Dressing: 2 x 2 sterile gauze and Band-Aid    Post-procedure details: Patient was observed during the procedure. Post-procedure instructions were reviewed.  Patient left the clinic in stable condition.    Clinical History:  MRI LUMBAR SPINE WITHOUT CONTRAST  TECHNIQUE: Multiplanar, multisequence MR imaging of the lumbar spine was performed. No intravenous contrast was administered.  COMPARISON:  Plain films lumbar spine 03/14/2019 from [facility] [employer] [facility].  FINDINGS: Segmentation:  Standard.  Alignment:  Trace retrolisthesis L4 on L5 noted.  Vertebrae: No fracture, evidence of discitis, or bone lesion. Degenerative endplate signal change is present at L4-5 and L5-S1.  Conus medullaris and cauda equina: Conus extends to the L1-2 level. Conus and cauda equina appear normal.  Paraspinal and other soft tissues: Negative.  Disc levels:  T11-12: Minimal disc bulge.  No stenosis.  T12-L1: Negative.  L1-2: Negative.  L2-3: Negative.  L3-4: Negative.  L4-5: The patient has a broad-based right paracentral disc protrusion resulting in narrowing in the right lateral recess which could impact the descending L5 root. Neural foramina are open.  L5-S1: Shallow broad-based central protrusion without stenosis.  IMPRESSION: Broad-based right paracentral protrusion at L4-5 causes narrowing in the right lateral recess and could impact the right L5 root.  Shallow broad-based central protrusion L5-S1 without stenosis.   Electronically Signed   By: Inge Rise M.D.   On: 04/07/2019 11:55 -- MRI CERVICAL SPINE WITHOUT AND WITH CONTRAST   Disc levels:  C2-C3: Unremarkable.  C3-C4: Broad left paracentral disc protrusion indents and effaces the central and left ventral thecal sac (series 6, image 17). Slight cephalad and caudad migration of disc material. Protruding disc flattens the cervical spinal cord with probable subtle cord signal abnormality. Moderate to severe spinal stenosis with the thecal sac measuring 6 mm in AP diameter at its most narrow point. Protruding disc extends laterally into the left neural foramen with associated severe left C4 foraminal stenosis.  Superimposed uncovertebral spurring with moderate right foraminal narrowing as well.  C4-C5: Small left paracentral disc protrusion indents the left ventral thecal sac. Mild flattening of the left hemi cord without cord signal changes. Mild spinal stenosis. Foramina remain patent.  C5-C6: Left paracentral disc protrusion indents the left ventral thecal sac. Flattening of the left hemi cord without cord signal changes. Mild to moderate left-sided spinal stenosis. Foramina remain patent.  C6-C7: Broad left paracentral disc protrusion indents the central and left ventral thecal sac. Flattening of the left hemi cord without cord signal changes. Moderate left-sided spinal stenosis. Mild bilateral C7 foraminal stenosis.  C7-T1: Mild facet hypertrophy on the right. No significant stenosis.  Visualized upper thoracic spine demonstrates no significant finding.  IMPRESSION: 1. Left paracentral disc protrusion at C3-4 with lateral extension into the left neural foramen, with resultant moderate to severe canal and severe left C4 foraminal stenosis. Subtle cord signal abnormality at this level suspicious for mild edema and/or myelomalacia. 2. Additional left paracentral disc protrusions at C4-5, C5-6, and C6-7 with resultant mild to moderate spinal stenosis as above. Secondary cord flattening at these levels without cord signal changes.   Electronically Signed By: Jeannine Boga M.D. On: 03/31/2018 19:14     Objective:  VS:  HT:    WT:   BMI:     BP:(!) 162/102  HR:76bpm  TEMP: ( )  RESP:  Physical Exam  Ortho Exam Imaging: Xr C-arm No Report  Result Date:  04/30/2019 Please see Notes tab for imaging impression.

## 2019-05-15 ENCOUNTER — Other Ambulatory Visit: Payer: Self-pay

## 2019-05-15 ENCOUNTER — Encounter: Payer: Self-pay | Admitting: Orthopaedic Surgery

## 2019-05-15 ENCOUNTER — Ambulatory Visit (INDEPENDENT_AMBULATORY_CARE_PROVIDER_SITE_OTHER): Payer: BC Managed Care – PPO | Admitting: Orthopaedic Surgery

## 2019-05-15 VITALS — BP 139/96 | HR 72 | Ht 71.0 in | Wt 238.0 lb

## 2019-05-15 DIAGNOSIS — M5126 Other intervertebral disc displacement, lumbar region: Secondary | ICD-10-CM | POA: Diagnosis not present

## 2019-05-15 NOTE — Progress Notes (Signed)
Office Visit Note   Patient: Paul Huff           Date of Birth: [birth year]           MRN: [id number] Visit Date: 05/15/2019              Requested by: Shirline FreesNafziger, Cory, NP [address] [place] [id number] PCP: Shirline FreesNafziger, Cory, NP   Assessment & Plan: Visit Diagnoses:  1. Protrusion of lumbar intervertebral disc     Plan: Right L4-5 disc protrusion with persistent radiculopathy symptoms.  He has been through epidural, therapy, prednisone Dosepak, muscle relaxants, narcotic pain medication, walking program, home exercise program without relief.  MRI scan is reviewed with him we discussed surgical intervention with microdiscectomy overnight stay in the hospital.  Risk surgery discussed including possibility of dural tear, recurrent disc rupture.  He has some disc degeneration at L5-S1 slightly worse than the left but no left leg symptoms.  Disc protrusion on the right is causing lateral recess compression contacting the L5 nerve root on the right.  Questions were elicited and answered he understands and requests we proceed.  He understands he be out of work for 6 to 7 weeks after the procedure and would be limited in sitting after the surgery.  Walking program discussed and weight loss also discussed.  Follow-Up Instructions: No follow-ups on file.   Orders:  No orders of the defined types were placed in this encounter.  No orders of the defined types were placed in this encounter.     Procedures: No procedures performed   Clinical Data: No additional findings.   Subjective: Chief Complaint  Patient presents with  . Lower Back - Follow-up, Pain    HPI 48 year old male returns with ongoing problems with back pain with right paracentral prominent disc protrusion and radiculopathy right L5.  He has had pain for greater than 8 years with intermittent symptoms.  He has had to take pain medication in the past currently on Ultram prior to that on Norco.  He  is been on anti-inflammatories, muscle relaxants physical therapy.  Epidural injection done 04/30/2019 gave him great relief for 7 to 10 days and then has had recurrent symptoms.  He was up at 3 AM this morning had to walk for 45 minutes before he is able to get back to sleep due to back pain right buttocks pain and right leg pain.  Patient's been healthy otherwise other than carpal tunnel release and cervical disc arthroplasty both doing well and asymptomatic.  Patient denies fever chills no bowel bladder symptoms.  Increased pain with bending.  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  Patient is requesting surgery at this point which he previously have discussed.  Review of Systems 14 point update unchanged from 04/11/2019 office visit other than as mentioned above in HPI.   Objective: Vital Signs: BP (!) 139/96   Pulse 72   Ht 5\' 11"  (1.803 m)   Wt 238 lb (108 kg)   BMI 33.19 kg/m   Physical Exam Constitutional:      Appearance: He is well-developed.  HENT:     Head: Normocephalic and atraumatic.  Eyes:     Pupils: Pupils are equal, round, and reactive to light.  Neck:     Thyroid: No thyromegaly.     Trachea: No tracheal deviation.     Comments: Good cervical range of motion no brachial plexus  tenderness well-healed transverse incision from disc arthroplasty on the left side. Cardiovascular:     Rate and Rhythm: Normal rate.  Pulmonary:     Effort: Pulmonary effort is normal.     Breath sounds: No wheezing.  Abdominal:     General: Bowel sounds are normal.     Palpations: Abdomen is soft.  Skin:    General: Skin is warm and dry.     Capillary Refill: Capillary refill takes less than 2 seconds.  Neurological:     Mental Status: He is alert and oriented to person, place, and time.  Psychiatric:        Behavior: Behavior normal.        Thought Content: Thought content normal.        Judgment: Judgment normal.     Ortho Exam  patient has positive straight leg raising on the right at 80 degrees with positive popliteal compression test.  Gastrocsoleus is strong.  No atrophy to the calf or anterior compartment.  He has weakness of the EHL on the right but no anterior tib weakness which is unchanged from previous exams.  Decreased sensation dorsum of the right foot.  Specialty Comments:  No specialty comments available.  Imaging:Study Result  CLINICAL DATA:  Chronic low back pain radiating into the right groin and leg.  EXAM: MRI LUMBAR SPINE WITHOUT CONTRAST  TECHNIQUE: Multiplanar, multisequence MR imaging of the lumbar spine was performed. No intravenous contrast was administered.  COMPARISON:  Plain films lumbar spine 03/14/2019 from [facility].  FINDINGS: Segmentation:  Standard.  Alignment:  Trace retrolisthesis L4 on L5 noted.  Vertebrae: No fracture, evidence of discitis, or bone lesion. Degenerative endplate signal change is present at L4-5 and L5-S1.  Conus medullaris and cauda equina: Conus extends to the L1-2 level. Conus and cauda equina appear normal.  Paraspinal and other soft tissues: Negative.  Disc levels:  T11-12: Minimal disc bulge.  No stenosis.  T12-L1: Negative.  L1-2: Negative.  L2-3: Negative.  L3-4: Negative.  L4-5: The patient has a broad-based right paracentral disc protrusion resulting in narrowing in the right lateral recess which could impact the descending L5 root. Neural foramina are open.  L5-S1: Shallow broad-based central protrusion without stenosis.  IMPRESSION: Broad-based right paracentral protrusion at L4-5 causes narrowing in the right lateral recess and could impact the right L5 root.  Shallow broad-based central protrusion L5-S1 without stenosis.   Electronically Signed   By: Drusilla Kannerhomas  Dalessio M.D.   On: 04/07/2019 11:55       PMFS History: Patient Active Problem List   Diagnosis Date Noted  .  Protrusion of lumbar intervertebral disc 04/11/2019  . S/P cervical disc replacement 08/09/2018  . Carpal tunnel syndrome, left upper limb 07/21/2018  . Cervical disc herniation 07/21/2018   Past Medical History:  Diagnosis Date  . Acid reflux   . Sciatica     Family History  Adopted: Yes    Past Surgical History:  Procedure Laterality Date  . CARPAL TUNNEL RELEASE Left 08/02/2018   Procedure: LEFT CARPAL TUNNEL RELEASE (2nd);  Surgeon: Eldred MangesYates, Tamsyn Owusu C, MD;  [facility];  Service: Orthopedics;  Laterality: Left;  . CERVICAL DISC ARTHROPLASTY N/A 08/02/2018   Procedure: C3-4 CERVICAL DISC ARTHROPLASTY (1st);  Surgeon: Eldred MangesYates, Marcha Licklider C, MD;  [facility];  Service: Orthopedics;  Laterality: N/A;  . TONSILLECTOMY  1985   Social History   Occupational History  . Not on file  Tobacco Use  . Smoking status: Former Smoker  Quit date: 06/27/2017    Years since quitting: 1.8  . Smokeless tobacco: Never Used  Substance and Sexual Activity  . Alcohol use: Yes    Alcohol/week: 8.0 standard drinks    Types: 8 Shots of liquor per week    Comment: social drinking on weekend  . Drug use: Yes    Types: Marijuana  . Sexual activity: Not Currently

## 2019-05-23 ENCOUNTER — Encounter: Payer: Self-pay | Admitting: Surgery

## 2019-05-23 ENCOUNTER — Ambulatory Visit (INDEPENDENT_AMBULATORY_CARE_PROVIDER_SITE_OTHER): Payer: BC Managed Care – PPO | Admitting: Surgery

## 2019-05-23 VITALS — BP 137/89 | HR 71 | Ht 71.0 in | Wt 238.0 lb

## 2019-05-23 DIAGNOSIS — M5126 Other intervertebral disc displacement, lumbar region: Secondary | ICD-10-CM

## 2019-05-23 MED ORDER — TRAMADOL HCL 50 MG PO TABS: 50.0000 mg | ORAL_TABLET | Freq: Two times a day (BID) | ORAL | 0 refills | Status: DC | PRN

## 2019-05-23 NOTE — Progress Notes (Signed)
48 year old black male history of right L4-5 HNP, low back pain and right lower extremity radiculopathy comes in for preop evaluation.  States that symptoms unchanged from previous visit.  He is wanting to proceed with right L4-5 microdiscectomy as scheduled.  Today history and physical performed.  Surgical procedure along with potential rehab/recovery time discussed in detail.  All questions answered and he wishes to proceed with surgery as scheduled.

## 2019-05-28 ENCOUNTER — Other Ambulatory Visit: Payer: Self-pay | Admitting: Adult Health

## 2019-05-29 NOTE — Progress Notes (Signed)
[id number] [place], [place] [address] [place] [address] Phone: [phone] Fax: [phone]    Your procedure is scheduled on Wednesday, September 9th.  Report to [facility] Main Entrance "A" at 10:30 A.M., and check in at the Admitting office.  Call this number if you have problems the morning of surgery:  [phone]  Call [phone] if you have any questions prior to your surgery date Monday-Friday 8am-4pm   Remember:  Do not eat after midnight the night before your surgery  You may drink clear liquids until 9:30 the morning of your surgery.   Clear liquids allowed are: Water, Non-Citrus Juices (without pulp), Carbonated Beverages, Clear Tea, Black Coffee Only, and Gatorade   Please complete your PRE-SURGERY ENSURE that was provided to you by 9:30 the morning of surgery.  Please, if able, drink it in one setting. DO NOT SIP.   Take these medicines the morning of surgery with A SIP OF WATER  If needed - traMADol (ULTRAM)   7 days prior to surgery STOP taking any Aspirin (unless otherwise instructed by your surgeon), Aleve, Naproxen, Ibuprofen, Motrin, Advil, Goody's, BC's, all herbal medications, fish oil, and all vitamins.   The Morning of Surgery  Do not wear jewelry, make-up or nail polish.  Do not wear lotions, powders, or perfumes/colognes, or deodorant  Do not shave 48 hours prior to surgery.  Men may shave face and neck.  Do not bring valuables to the hospital.  [facility] is not responsible for any belongings or valuables.  If you are a smoker, DO NOT Smoke 24 hours prior to surgery IF you wear a CPAP at night please bring your mask, tubing, and machine the morning of surgery   Remember that you must have someone to transport you home after your surgery, and remain with you for 24 hours if you are discharged the same day.  Contacts, glasses, hearing aids, dentures or bridgework  may not be worn into surgery.   Leave your suitcase in the car.  After surgery it may be brought to your room.  For patients admitted to the hospital, discharge time will be determined by your treatment team.  Patients discharged the day of surgery will not be allowed to drive home.   Special instructions:   [place]- Preparing For Surgery  Before surgery, you can play an important role. Because skin is not sterile, your skin needs to be as free of germs as possible. You can reduce the number of germs on your skin by washing with CHG (chlorahexidine gluconate) Soap before surgery.  CHG is an antiseptic cleaner which kills germs and bonds with the skin to continue killing germs even after washing.    Oral Hygiene is also important to reduce your risk of infection.  Remember - BRUSH YOUR TEETH THE MORNING OF SURGERY WITH YOUR REGULAR TOOTHPASTE  Please do not use if you have an allergy to CHG or antibacterial soaps. If your skin becomes reddened/irritated stop using the CHG.  Do not shave (including legs and underarms) for at least 48 hours prior to first CHG shower. It is OK to shave your face.  Please follow these instructions carefully.   1. Shower the NIGHT BEFORE SURGERY and the MORNING OF SURGERY with CHG Soap.   2. If you chose to wash your hair, wash your hair first as usual with your normal shampoo.  3. After you shampoo, rinse your  hair and body thoroughly to remove the shampoo.  4. Use CHG as you would any other liquid soap. You can apply CHG directly to the skin and wash gently with a scrungie or a clean washcloth.   5. Apply the CHG Soap to your body ONLY FROM THE NECK DOWN.  Do not use on open wounds or open sores. Avoid contact with your eyes, ears, mouth and genitals (private parts). Wash Face and genitals (private parts)  with your normal soap.   6. Wash thoroughly, paying special attention to the area where your surgery will be performed.  7. Thoroughly rinse your  body with warm water from the neck down.  8. DO NOT shower/wash with your normal soap after using and rinsing off the CHG Soap.  9. Pat yourself dry with a CLEAN TOWEL.  10. Wear CLEAN PAJAMAS to bed the night before surgery, wear comfortable clothes the morning of surgery  11. Place CLEAN SHEETS on your bed the night of your first shower and DO NOT SLEEP WITH PETS.  Day of Surgery: Do not apply any deodorants/lotions. Please shower the morning of surgery with the CHG soap  Please wear clean clothes to the hospital/surgery center.   Remember to brush your teeth WITH YOUR REGULAR TOOTHPASTE.  Please read over the following fact sheets that you were given.

## 2019-05-30 ENCOUNTER — Other Ambulatory Visit: Payer: Self-pay

## 2019-05-30 ENCOUNTER — Encounter (HOSPITAL_COMMUNITY): Payer: Self-pay

## 2019-05-30 ENCOUNTER — Ambulatory Visit (HOSPITAL_COMMUNITY)
Admission: RE | Admit: 2019-05-30 | Discharge: 2019-05-30 | Disposition: A | Discharge status: Home / Self Care | Payer: BC Managed Care – PPO | Source: Ambulatory Visit | Attending: Surgery | Admitting: Surgery

## 2019-05-30 ENCOUNTER — Encounter (HOSPITAL_COMMUNITY)
Admission: RE | Admit: 2019-05-30 | Discharge: 2019-05-30 | Disposition: A | Discharge status: Home / Self Care | Payer: BC Managed Care – PPO | Source: Ambulatory Visit | Attending: Orthopaedic Surgery | Admitting: Orthopaedic Surgery

## 2019-05-30 DIAGNOSIS — Z01818 Encounter for other preprocedural examination: Secondary | ICD-10-CM | POA: Diagnosis not present

## 2019-05-30 HISTORY — DX: Unspecified osteoarthritis, unspecified site: M19.90

## 2019-05-30 LAB — URINALYSIS, ROUTINE W REFLEX MICROSCOPIC
Bacteria, UA: NONE SEEN
Bilirubin Urine: NEGATIVE
Glucose, UA: NEGATIVE mg/dL
Ketones, ur: NEGATIVE mg/dL
Leukocytes,Ua: NEGATIVE
Nitrite: NEGATIVE
Protein, ur: NEGATIVE mg/dL
Specific Gravity, Urine: 1.015 (ref 1.005–1.030)
pH: 7 (ref 5.0–8.0)

## 2019-05-30 LAB — COMPREHENSIVE METABOLIC PANEL
ALT: 24 U/L (ref 0–44)
AST: 20 U/L (ref 15–41)
Albumin: 4 g/dL (ref 3.5–5.0)
Alkaline Phosphatase: 56 U/L (ref 38–126)
Anion gap: 9 (ref 5–15)
BUN: 10 mg/dL (ref 6–20)
CO2: 25 mmol/L (ref 22–32)
Calcium: 9.1 mg/dL (ref 8.9–10.3)
Chloride: 104 mmol/L (ref 98–111)
Creatinine, Ser: 1.02 mg/dL (ref 0.61–1.24)
GFR calc Af Amer: >60 mL/min (ref >60)
GFR calc non Af Amer: >60 mL/min (ref >60)
Glucose, Bld: 97 mg/dL (ref 70–99)
Potassium: 4 mmol/L (ref 3.5–5.1)
Sodium: 138 mmol/L (ref 135–145)
Total Bilirubin: 0.8 mg/dL (ref 0.3–1.2)
Total Protein: 6.8 g/dL (ref 6.5–8.1)

## 2019-05-30 LAB — CBC
HCT: 45.6 % (ref 39.0–52.0)
Hemoglobin: 15.2 g/dL (ref 13.0–17.0)
MCH: 28.6 pg (ref 26.0–34.0)
MCHC: 33.3 g/dL (ref 30.0–36.0)
MCV: 85.7 fL (ref 80.0–100.0)
Platelets: 311 10*3/uL (ref 150–400)
RBC: 5.32 MIL/uL (ref 4.22–5.81)
RDW: 13.2 % (ref 11.5–15.5)
WBC: 11.2 10*3/uL — ABNORMAL HIGH (ref 4.0–10.5)
nRBC: 0 % (ref 0.0–0.2)

## 2019-05-30 LAB — SURGICAL PCR SCREEN
MRSA, PCR: NEGATIVE
Staphylococcus aureus: NEGATIVE

## 2019-05-30 NOTE — Progress Notes (Signed)
PCP: Dorothyann Peng, NP Cardiologist: denies  EKG: today CXR: today ECHO: denies Stress Test: denies Cardiac Cath: denies   Patient denies shortness of breath, fever, cough, and chest pain at PAT appointment.  Patient verbalized understanding of instructions provided today at the PAT appointment.  Patient asked to review instructions at home and day of surgery.

## 2019-06-01 ENCOUNTER — Other Ambulatory Visit (HOSPITAL_COMMUNITY): Payer: BC Managed Care – PPO

## 2019-06-02 ENCOUNTER — Other Ambulatory Visit (HOSPITAL_COMMUNITY)
Admission: RE | Admit: 2019-06-02 | Discharge: 2019-06-02 | Disposition: A | Discharge status: Home / Self Care | Payer: BC Managed Care – PPO | Source: Ambulatory Visit | Attending: Orthopaedic Surgery | Admitting: Orthopaedic Surgery

## 2019-06-02 DIAGNOSIS — Z20828 Contact with and (suspected) exposure to other viral communicable diseases: Secondary | ICD-10-CM | POA: Diagnosis not present

## 2019-06-02 DIAGNOSIS — Z01812 Encounter for preprocedural laboratory examination: Secondary | ICD-10-CM | POA: Insufficient documentation

## 2019-06-03 LAB — NOVEL CORONAVIRUS, NAA (HOSP ORDER, SEND-OUT TO REF LAB; TAT 18-24 HRS): SARS-CoV-2, NAA: NOT DETECTED

## 2019-06-05 ENCOUNTER — Other Ambulatory Visit: Payer: Self-pay

## 2019-06-05 MED ORDER — CEFAZOLIN SODIUM-DEXTROSE 2-4 GM/100ML-% IV SOLN
2.0000 g | INTRAVENOUS | Status: AC
  Administered 2019-06-06: 13:00:00 2 g via INTRAVENOUS
  Filled 2019-06-05 (×2): qty 100

## 2019-06-06 ENCOUNTER — Ambulatory Visit (HOSPITAL_COMMUNITY): Payer: BC Managed Care – PPO

## 2019-06-06 ENCOUNTER — Observation Stay (HOSPITAL_COMMUNITY)
Admission: RE | Admit: 2019-06-06 | Discharge: 2019-06-07 | Disposition: A | Discharge status: Home / Self Care | Payer: BC Managed Care – PPO | Attending: Orthopaedic Surgery | Admitting: Orthopaedic Surgery

## 2019-06-06 ENCOUNTER — Ambulatory Visit (HOSPITAL_COMMUNITY): Payer: BC Managed Care – PPO | Admitting: Anesthesiology

## 2019-06-06 ENCOUNTER — Encounter (HOSPITAL_COMMUNITY): Payer: Self-pay

## 2019-06-06 ENCOUNTER — Encounter (HOSPITAL_COMMUNITY)
Admission: RE | Disposition: A | Discharge status: Home / Self Care | Payer: Self-pay | Source: Home / Self Care | Attending: Orthopaedic Surgery

## 2019-06-06 ENCOUNTER — Other Ambulatory Visit: Payer: Self-pay

## 2019-06-06 DIAGNOSIS — M1990 Unspecified osteoarthritis, unspecified site: Secondary | ICD-10-CM | POA: Insufficient documentation

## 2019-06-06 DIAGNOSIS — Z419 Encounter for procedure for purposes other than remedying health state, unspecified: Secondary | ICD-10-CM

## 2019-06-06 DIAGNOSIS — K219 Gastro-esophageal reflux disease without esophagitis: Secondary | ICD-10-CM | POA: Insufficient documentation

## 2019-06-06 DIAGNOSIS — Z87891 Personal history of nicotine dependence: Secondary | ICD-10-CM | POA: Insufficient documentation

## 2019-06-06 DIAGNOSIS — M5116 Intervertebral disc disorders with radiculopathy, lumbar region: Principal | ICD-10-CM | POA: Insufficient documentation

## 2019-06-06 DIAGNOSIS — M5126 Other intervertebral disc displacement, lumbar region: Secondary | ICD-10-CM

## 2019-06-06 DIAGNOSIS — G709 Myoneural disorder, unspecified: Secondary | ICD-10-CM | POA: Diagnosis not present

## 2019-06-06 DIAGNOSIS — Z981 Arthrodesis status: Secondary | ICD-10-CM | POA: Diagnosis not present

## 2019-06-06 HISTORY — PX: LUMBAR LAMINECTOMY: SHX95

## 2019-06-06 SURGERY — MICRODISCECTOMY LUMBAR LAMINECTOMY
Anesthesia: General | Site: Spine Lumbar

## 2019-06-06 MED ORDER — METHOCARBAMOL 1000 MG/10ML IJ SOLN
500.0000 mg | Freq: Four times a day (QID) | INTRAVENOUS | Status: DC | PRN
  Filled 2019-06-06: qty 5

## 2019-06-06 MED ORDER — DOCUSATE SODIUM 100 MG PO CAPS
100.0000 mg | ORAL_CAPSULE | Freq: Two times a day (BID) | ORAL | Status: DC
  Administered 2019-06-06 – 2019-06-07 (×3): 100 mg via ORAL
  Filled 2019-06-06 (×3): qty 1

## 2019-06-06 MED ORDER — METHOCARBAMOL 500 MG PO TABS: 500.0000 mg | ORAL_TABLET | Freq: Four times a day (QID) | ORAL | 0 refills | Status: DC | PRN

## 2019-06-06 MED ORDER — FENTANYL CITRATE (PF) 250 MCG/5ML IJ SOLN
INTRAMUSCULAR | Status: AC
  Filled 2019-06-06: qty 5

## 2019-06-06 MED ORDER — FENTANYL CITRATE (PF) 250 MCG/5ML IJ SOLN
INTRAMUSCULAR | Status: DC | PRN
  Administered 2019-06-06: 50 ug via INTRAVENOUS
  Administered 2019-06-06 (×2): 100 ug via INTRAVENOUS

## 2019-06-06 MED ORDER — MIDAZOLAM HCL 2 MG/2ML IJ SOLN
INTRAMUSCULAR | Status: AC
  Filled 2019-06-06: qty 2

## 2019-06-06 MED ORDER — OXYCODONE-ACETAMINOPHEN 5-325 MG PO TABS: 1.0000 | ORAL_TABLET | Freq: Four times a day (QID) | ORAL | 0 refills | Status: DC | PRN

## 2019-06-06 MED ORDER — ONDANSETRON HCL 4 MG/2ML IJ SOLN
INTRAMUSCULAR | Status: DC | PRN
  Administered 2019-06-06: 4 mg via INTRAVENOUS

## 2019-06-06 MED ORDER — OXYCODONE HCL 5 MG PO TABS
ORAL_TABLET | ORAL | Status: AC
  Filled 2019-06-06: qty 1

## 2019-06-06 MED ORDER — MENTHOL 3 MG MT LOZG: 1.0000 | LOZENGE | OROMUCOSAL | Status: DC | PRN

## 2019-06-06 MED ORDER — FENTANYL CITRATE (PF) 100 MCG/2ML IJ SOLN
INTRAMUSCULAR | Status: AC
  Filled 2019-06-06: qty 2

## 2019-06-06 MED ORDER — ONDANSETRON HCL 4 MG/2ML IJ SOLN: 4.0000 mg | Freq: Four times a day (QID) | INTRAMUSCULAR | Status: DC | PRN

## 2019-06-06 MED ORDER — LACTATED RINGERS IV SOLN
INTRAVENOUS | Status: DC
  Administered 2019-06-06: 11:00:00 via INTRAVENOUS

## 2019-06-06 MED ORDER — ONDANSETRON HCL 4 MG PO TABS: 4.0000 mg | ORAL_TABLET | Freq: Four times a day (QID) | ORAL | Status: DC | PRN

## 2019-06-06 MED ORDER — SODIUM CHLORIDE 0.9 % IV SOLN: INTRAVENOUS | Status: DC

## 2019-06-06 MED ORDER — SODIUM CHLORIDE 0.9% FLUSH
3.0000 mL | Freq: Two times a day (BID) | INTRAVENOUS | Status: DC
  Administered 2019-06-06: 20:00:00 3 mL via INTRAVENOUS

## 2019-06-06 MED ORDER — OXYCODONE HCL 5 MG PO TABS
5.0000 mg | ORAL_TABLET | Freq: Once | ORAL | Status: AC | PRN
  Administered 2019-06-06: 15:00:00 5 mg via ORAL

## 2019-06-06 MED ORDER — LIDOCAINE 2% (20 MG/ML) 5 ML SYRINGE
INTRAMUSCULAR | Status: AC
  Filled 2019-06-06: qty 5

## 2019-06-06 MED ORDER — BUPIVACAINE HCL (PF) 0.25 % IJ SOLN
INTRAMUSCULAR | Status: AC
  Filled 2019-06-06: qty 10

## 2019-06-06 MED ORDER — LIDOCAINE 2% (20 MG/ML) 5 ML SYRINGE
INTRAMUSCULAR | Status: DC | PRN
  Administered 2019-06-06: 60 mg via INTRAVENOUS

## 2019-06-06 MED ORDER — DEXAMETHASONE SODIUM PHOSPHATE 10 MG/ML IJ SOLN
INTRAMUSCULAR | Status: DC | PRN
  Administered 2019-06-06: 5 mg via INTRAVENOUS

## 2019-06-06 MED ORDER — SODIUM CHLORIDE 0.9% FLUSH: 3.0000 mL | INTRAVENOUS | Status: DC | PRN

## 2019-06-06 MED ORDER — CEFAZOLIN SODIUM-DEXTROSE 1-4 GM/50ML-% IV SOLN
1.0000 g | Freq: Three times a day (TID) | INTRAVENOUS | Status: AC
  Administered 2019-06-06 – 2019-06-07 (×2): 1 g via INTRAVENOUS
  Filled 2019-06-06 (×2): qty 50

## 2019-06-06 MED ORDER — HYDROMORPHONE HCL 1 MG/ML IJ SOLN
0.5000 mg | INTRAMUSCULAR | Status: DC | PRN
  Administered 2019-06-06: 0.5 mg via INTRAVENOUS
  Filled 2019-06-06: qty 0.5

## 2019-06-06 MED ORDER — OXYCODONE HCL 5 MG PO TABS
5.0000 mg | ORAL_TABLET | ORAL | Status: DC | PRN
  Administered 2019-06-06 – 2019-06-07 (×4): 5 mg via ORAL
  Filled 2019-06-06 (×4): qty 1

## 2019-06-06 MED ORDER — ONDANSETRON HCL 4 MG/2ML IJ SOLN
INTRAMUSCULAR | Status: AC
  Filled 2019-06-06: qty 2

## 2019-06-06 MED ORDER — METHOCARBAMOL 500 MG PO TABS
ORAL_TABLET | ORAL | Status: AC
  Filled 2019-06-06: qty 1

## 2019-06-06 MED ORDER — FENTANYL CITRATE (PF) 100 MCG/2ML IJ SOLN
25.0000 ug | INTRAMUSCULAR | Status: DC | PRN
  Administered 2019-06-06: 15:00:00 50 ug via INTRAVENOUS

## 2019-06-06 MED ORDER — PHENOL 1.4 % MT LIQD: 1.0000 | OROMUCOSAL | Status: DC | PRN

## 2019-06-06 MED ORDER — POLYETHYLENE GLYCOL 3350 17 G PO PACK: 17.0000 g | PACK | Freq: Every day | ORAL | Status: DC | PRN

## 2019-06-06 MED ORDER — 0.9 % SODIUM CHLORIDE (POUR BTL) OPTIME
TOPICAL | Status: DC | PRN
  Administered 2019-06-06: 13:00:00 1000 mL

## 2019-06-06 MED ORDER — ROCURONIUM BROMIDE 10 MG/ML (PF) SYRINGE
PREFILLED_SYRINGE | INTRAVENOUS | Status: DC | PRN
  Administered 2019-06-06: 60 mg via INTRAVENOUS
  Administered 2019-06-06: 20 mg via INTRAVENOUS

## 2019-06-06 MED ORDER — NON FORMULARY
Status: DC | PRN
  Administered 2019-06-06: 13:00:00 900 mL

## 2019-06-06 MED ORDER — PHENYLEPHRINE 40 MCG/ML (10ML) SYRINGE FOR IV PUSH (FOR BLOOD PRESSURE SUPPORT)
PREFILLED_SYRINGE | INTRAVENOUS | Status: AC
  Filled 2019-06-06: qty 10

## 2019-06-06 MED ORDER — ROCURONIUM BROMIDE 10 MG/ML (PF) SYRINGE
PREFILLED_SYRINGE | INTRAVENOUS | Status: AC
  Filled 2019-06-06: qty 10

## 2019-06-06 MED ORDER — PROPOFOL 10 MG/ML IV BOLUS
INTRAVENOUS | Status: DC | PRN
  Administered 2019-06-06: 200 mg via INTRAVENOUS

## 2019-06-06 MED ORDER — MIDAZOLAM HCL 2 MG/2ML IJ SOLN
INTRAMUSCULAR | Status: DC | PRN
  Administered 2019-06-06: 2 mg via INTRAVENOUS

## 2019-06-06 MED ORDER — SUGAMMADEX SODIUM 200 MG/2ML IV SOLN
INTRAVENOUS | Status: DC | PRN
  Administered 2019-06-06: 432 mg via INTRAVENOUS

## 2019-06-06 MED ORDER — PROPOFOL 10 MG/ML IV BOLUS
INTRAVENOUS | Status: AC
  Filled 2019-06-06: qty 40

## 2019-06-06 MED ORDER — PHENYLEPHRINE 40 MCG/ML (10ML) SYRINGE FOR IV PUSH (FOR BLOOD PRESSURE SUPPORT)
PREFILLED_SYRINGE | INTRAVENOUS | Status: DC | PRN
  Administered 2019-06-06: 80 ug via INTRAVENOUS

## 2019-06-06 MED ORDER — OXYCODONE HCL 5 MG/5ML PO SOLN: 5.0000 mg | Freq: Once | ORAL | Status: AC | PRN

## 2019-06-06 MED ORDER — METHOCARBAMOL 500 MG PO TABS
500.0000 mg | ORAL_TABLET | Freq: Four times a day (QID) | ORAL | Status: DC | PRN
  Administered 2019-06-06 – 2019-06-07 (×3): 500 mg via ORAL
  Filled 2019-06-06 (×2): qty 1

## 2019-06-06 MED ORDER — CHLORHEXIDINE GLUCONATE 4 % EX LIQD: 60.0000 mL | Freq: Once | CUTANEOUS | Status: DC

## 2019-06-06 MED ORDER — DEXAMETHASONE SODIUM PHOSPHATE 10 MG/ML IJ SOLN
INTRAMUSCULAR | Status: AC
  Filled 2019-06-06: qty 1

## 2019-06-06 SURGICAL SUPPLY — 44 items
ADH SKN CLS APL DERMABOND .7 (GAUZE/BANDAGES/DRESSINGS) ×1
BUR ROUND FLUTED 4 SOFT TCH (BURR) ×2 IMPLANT
CANISTER SUCT 3000ML PPV (MISCELLANEOUS) ×2 IMPLANT
COVER SURGICAL LIGHT HANDLE (MISCELLANEOUS) ×2 IMPLANT
COVER WAND RF STERILE (DRAPES) IMPLANT
DERMABOND ADVANCED (GAUZE/BANDAGES/DRESSINGS) ×1
DERMABOND ADVANCED .7 DNX12 (GAUZE/BANDAGES/DRESSINGS) ×1 IMPLANT
DRAPE INCISE IOBAN 66X45 STRL (DRAPES) ×2 IMPLANT
DRAPE MICROSCOPE LEICA (MISCELLANEOUS) ×2 IMPLANT
DRSG MEPILEX BORDER 4X4 (GAUZE/BANDAGES/DRESSINGS) IMPLANT
DRSG MEPILEX BORDER 4X8 (GAUZE/BANDAGES/DRESSINGS) ×2 IMPLANT
DURAPREP 26ML APPLICATOR (WOUND CARE) ×2 IMPLANT
DURASEAL SPINE SEALANT 3ML (MISCELLANEOUS) IMPLANT
ELECT REM PT RETURN 9FT ADLT (ELECTROSURGICAL) ×2
ELECTRODE REM PT RTRN 9FT ADLT (ELECTROSURGICAL) ×1 IMPLANT
GAUZE SPONGE 4X4 12PLY STRL (GAUZE/BANDAGES/DRESSINGS) ×2 IMPLANT
GLOVE BIOGEL PI IND STRL 8 (GLOVE) ×2 IMPLANT
GLOVE BIOGEL PI INDICATOR 8 (GLOVE) ×2
GLOVE ORTHO TXT STRL SZ7.5 (GLOVE) ×6 IMPLANT
GOWN STRL REUS W/ TWL LRG LVL3 (GOWN DISPOSABLE) ×1 IMPLANT
GOWN STRL REUS W/ TWL XL LVL3 (GOWN DISPOSABLE) ×2 IMPLANT
GOWN STRL REUS W/TWL 2XL LVL3 (GOWN DISPOSABLE) ×2 IMPLANT
GOWN STRL REUS W/TWL LRG LVL3 (GOWN DISPOSABLE) ×2
GOWN STRL REUS W/TWL XL LVL3 (GOWN DISPOSABLE) ×4
KIT BASIN OR (CUSTOM PROCEDURE TRAY) ×2 IMPLANT
KIT TURNOVER KIT B (KITS) ×2 IMPLANT
NEEDLE SPNL 18GX3.5 QUINCKE PK (NEEDLE) ×2 IMPLANT
NS IRRIG 1000ML POUR BTL (IV SOLUTION) ×2 IMPLANT
PACK LAMINECTOMY ORTHO (CUSTOM PROCEDURE TRAY) ×2 IMPLANT
PAD ARMBOARD 7.5X6 YLW CONV (MISCELLANEOUS) ×4 IMPLANT
PATTIES SURGICAL .5 X.5 (GAUZE/BANDAGES/DRESSINGS) IMPLANT
PATTIES SURGICAL .75X.75 (GAUZE/BANDAGES/DRESSINGS) IMPLANT
STAPLER VISISTAT 35W (STAPLE) ×2 IMPLANT
SUT VIC AB 0 CT1 27 (SUTURE) ×2
SUT VIC AB 0 CT1 27XBRD ANBCTR (SUTURE) ×1 IMPLANT
SUT VIC AB 1 CTX 36 (SUTURE) ×4
SUT VIC AB 1 CTX36XBRD ANBCTR (SUTURE) ×2 IMPLANT
SUT VIC AB 2-0 CT1 27 (SUTURE) ×2
SUT VIC AB 2-0 CT1 TAPERPNT 27 (SUTURE) ×1 IMPLANT
SUT VIC AB 3-0 X1 27 (SUTURE) ×2 IMPLANT
SYR 20ML ECCENTRIC (SYRINGE) IMPLANT
TOWEL GREEN STERILE (TOWEL DISPOSABLE) ×2 IMPLANT
TOWEL GREEN STERILE FF (TOWEL DISPOSABLE) ×2 IMPLANT
WATER STERILE IRR 1000ML POUR (IV SOLUTION) ×2 IMPLANT

## 2019-06-06 NOTE — Op Note (Signed)
Preop diagnosis: Right L4-L5 paracentral HNP with radiculopathy.  Postop diagnosis: Same  Procedure: right L4-L5 microdiscectomy  Surgeon: Rodell Perna, MD  Assistant: Benjiman Core, PA-C medically necessary and present for the entire procedure  Anesthesia: General plus Marcaine local  EBL approximately 100 cc  Findings L4-L5 partially calcified disc fragment with fresh disc protrusion underneath.  Procedure: After standard prepping and draping prone positioning calf pumpers preoperative antibiotics chest rolls yellow foam pads on the shoulders and underneath the forearm and protecting the ulnar nerve a 1010 drape was applied at S1 level.  Back was prepped with DuraPrep area squared with towels Betadine Steri-Drape and spinal needle placed based on palpable landmarks expected be at L4-5.  Initial images show that the needle was at L3-4 is moved down one level repeat x-ray was taken and the skin was marked with a purple skin marker.  Midline incision was made couple millimeters to the right next to the spinous process subperiosteal dissection along the lamina and extra detail retractor placed due to the patient body habitus.  Penfield 4 was placed inferior aspect of the lamina and a second x-ray was taken confirming this was the L4-5 disc space and purple skin marker was used to mark the lamina and spinous process.  Lamina was thinned with the bur removed with a 2 and 3 mm Kerrison chunks of ligament were removed.  Partial removal of the facet was performed bone was removed out to the level of the pedicle just below the disc base.  There was calcification of the disc with a rockhard portion of the dura posterior longitudinal ligament or previously extruded disc that is calcified.  Had to be removed with a Kerrison sequentially and pieces were teased with fresh disc removed with straight micropituitary's underneath the calcified fragments.  Using a hockey-stick as well as Epstein Surette hard pieces of the  disc that were calcified were pushed into the disc space and then removed.  Lateral read says decompression was performed removing chunks of ligament to decompress the nerve root.  Careful irrigation with saline solution there was still some calcified disc at the midline but it could not be completely reached.  Undersurface disc pieces were removed.  Copious irrigation.  Dura was intact Valsalva to 30cm showed no leak.  Epidural veins were coagulated with bipolar cautery.  Repeat irrigation final passes were made anterior to the dura and good areas of decompression.  Final passes through the disc with the Derica protecting the dura followed by #1 Vicryl in the deep fascia ~subtendinous tissue skin closure postop dressing and transferred to the recovery room.  Patient tolerated the procedure well.

## 2019-06-06 NOTE — Transfer of Care (Signed)
Immediate Anesthesia Transfer of Care Note  Patient: Paul Huff  Procedure(s) Performed: RIGHT LUMBAR FOUR-FIVE  MICRODISCECTOMY (N/A Spine Lumbar)  Patient Location: PACU  Anesthesia Type:General  Level of Consciousness: awake, alert  and oriented  Airway & Oxygen Therapy: Patient Spontanous Breathing and Patient connected to face mask oxygen  Post-op Assessment: Report given to RN and Post -op Vital signs reviewed and stable  Post vital signs: Reviewed and stable  Last Vitals:  Vitals Value Taken Time  BP 138/87 06/06/19 1425  Temp    Pulse 89 06/06/19 1424  Resp 18 06/06/19 1424  SpO2 100 % 06/06/19 1424  Vitals shown include unvalidated device data.  Last Pain:  Vitals:   06/06/19 1056  TempSrc:   PainSc: 0-No pain         Complications: No apparent anesthesia complications

## 2019-06-06 NOTE — Interval H&P Note (Signed)
History and Physical Interval Note:  06/06/2019 12:34 PM  Paul Huff  has presented today for surgery, with the diagnosis of right L4-5 herniated nucleus pulposus.  The various methods of treatment have been discussed with the patient and family. After consideration of risks, benefits and other options for treatment, the patient has consented to  Procedure(s): RIGHT LUMBAR FOUR-FIVE  MICRODISCECTOMY (N/A) as a surgical intervention.  The patient's history has been reviewed, patient examined, no change in status, stable for surgery.  I have reviewed the patient's chart and labs.  Questions were answered to the patient's satisfaction.     [name]

## 2019-06-06 NOTE — Anesthesia Preprocedure Evaluation (Signed)
Anesthesia Evaluation  Patient identified by MRN, date of birth, ID band Patient awake    Reviewed: Allergy & Precautions, H&P , NPO status , Patient's Chart, lab work & pertinent test results  Airway Mallampati: II   Neck ROM: full    Dental   Pulmonary former smoker,    breath sounds clear to auscultation       Cardiovascular negative cardio ROS   Rhythm:regular Rate:Normal     Neuro/Psych  Neuromuscular disease    GI/Hepatic GERD  ,  Endo/Other    Renal/GU      Musculoskeletal  (+) Arthritis ,   Abdominal   Peds  Hematology   Anesthesia Other Findings   Reproductive/Obstetrics                             Anesthesia Physical Anesthesia Plan  ASA: II  Anesthesia Plan: General   Post-op Pain Management:    Induction: Intravenous  PONV Risk Score and Plan: 2 and Ondansetron, Dexamethasone, Midazolam and Treatment may vary due to age or medical condition  Airway Management Planned: Oral ETT  Additional Equipment:   Intra-op Plan:   Post-operative Plan: Extubation in OR  Informed Consent: I have reviewed the patients History and Physical, chart, labs and discussed the procedure including the risks, benefits and alternatives for the proposed anesthesia with the patient or authorized representative who has indicated his/her understanding and acceptance.       Plan Discussed with: CRNA, Anesthesiologist and Surgeon  Anesthesia Plan Comments:         Anesthesia Quick Evaluation

## 2019-06-06 NOTE — H&P (Signed)
Patient: Paul Huff                                         Date of Birth: July 14, 1971                                                    MRN: [id number] Visit Date: 05/15/2019                                                                     Requested by: Dorothyann Peng, NP [address],  [place] [address] PCP: Dorothyann Peng, NP   Assessment & Plan: Visit Diagnoses:  1. Protrusion of lumbar intervertebral disc     Plan: Right L4-5 disc protrusion with persistent radiculopathy symptoms.  He has been through epidural, therapy, prednisone Dosepak, muscle relaxants, narcotic pain medication, walking program, home exercise program without relief.  MRI scan is reviewed with him we discussed surgical intervention with microdiscectomy overnight stay in the hospital.  Risk surgery discussed including possibility of dural tear, recurrent disc rupture.  He has some disc degeneration at L5-S1 slightly worse than the left but no left leg symptoms.  Disc protrusion on the right is causing lateral recess compression contacting the L5 nerve root on the right.  Questions were elicited and answered he understands and requests we proceed.  He understands he be out of work for 6 to 7 weeks after the procedure and would be limited in sitting after the surgery.  Walking program discussed and weight loss also discussed.  Follow-Up Instructions: No follow-ups on file.   Orders:  No orders of the defined types were placed in this encounter.  No orders of the defined types were placed in this encounter.     Procedures: No procedures performed   Clinical Data: No additional findings.   Subjective:    Chief Complaint  Patient presents with  . Lower Back - Follow-up, Pain    HPI 48 year old male returns with ongoing problems with back pain with right paracentral prominent disc protrusion and radiculopathy right L5.  He has had pain for greater than 8 years with  intermittent symptoms.  He has had to take pain medication in the past currently on Ultram prior to that on Norco.  He is been on anti-inflammatories, muscle relaxants physical therapy.  Epidural injection done 04/30/2019 gave him great relief for 7 to 10 days and then has had recurrent symptoms.  He was up at 3 AM this morning had to walk for 45 minutes before he is able to get back to sleep due to back pain right buttocks pain and right leg pain.  Patient's been healthy otherwise other than carpal tunnel release and cervical disc arthroplasty both doing well and asymptomatic.  Patient denies fever chills no bowel bladder symptoms.  Increased pain with bending.  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  Patient is requesting surgery at this point which  he previously have discussed.  Review of Systems 14 point update unchanged from 04/11/2019 office visit other than as mentioned above in HPI.   Objective: Vital Signs: BP (!) 139/96   Pulse 72   Ht 5\' 11"  (1.803 m)   Wt 238 lb (108 kg)   BMI 33.19 kg/m   Physical Exam Constitutional:      Appearance: He is well-developed.  HENT:     Head: Normocephalic and atraumatic.  Eyes:     Pupils: Pupils are equal, round, and reactive to light.  Neck:     Thyroid: No thyromegaly.     Trachea: No tracheal deviation.     Comments: Good cervical range of motion no brachial plexus tenderness well-healed transverse incision from disc arthroplasty on the left side. Cardiovascular:     Rate and Rhythm: Normal rate.  Pulmonary:     Effort: Pulmonary effort is normal.     Breath sounds: No wheezing.  Abdominal:     General: Bowel sounds are normal.     Palpations: Abdomen is soft.  Skin:    General: Skin is warm and dry.     Capillary Refill: Capillary refill takes less than 2 seconds.  Neurological:     Mental Status: He is alert and oriented to person, place, and time.  Psychiatric:        Behavior:  Behavior normal.        Thought Content: Thought content normal.        Judgment: Judgment normal.     Ortho Exam patient has positive straight leg raising on the right at 80 degrees with positive popliteal compression test.  Gastrocsoleus is strong.  No atrophy to the calf or anterior compartment.  He has weakness of the EHL on the right but no anterior tib weakness which is unchanged from previous exams.  Decreased sensation dorsum of the right foot.  Specialty Comments:  No specialty comments available.  Imaging:Study Result  CLINICAL DATA: Chronic low back pain radiating into the right groin and leg.  EXAM: MRI LUMBAR SPINE WITHOUT CONTRAST  TECHNIQUE: Multiplanar, multisequence MR imaging of the lumbar spine was performed. No intravenous contrast was administered.  COMPARISON: Plain films lumbar spine 03/14/2019 from [facility].  FINDINGS: Segmentation: Standard.  Alignment: Trace retrolisthesis L4 on L5 noted.  Vertebrae: No fracture, evidence of discitis, or bone lesion. Degenerative endplate signal change is present at L4-5 and L5-S1.  Conus medullaris and cauda equina: Conus extends to the L1-2 level. Conus and cauda equina appear normal.  Paraspinal and other soft tissues: Negative.  Disc levels:  T11-12: Minimal disc bulge. No stenosis.  T12-L1: Negative.  L1-2: Negative.  L2-3: Negative.  L3-4: Negative.  L4-5: The patient has a broad-based right paracentral disc protrusion resulting in narrowing in the right lateral recess which could impact the descending L5 root. Neural foramina are open.  L5-S1: Shallow broad-based central protrusion without stenosis.  IMPRESSION: Broad-based right paracentral protrusion at L4-5 causes narrowing in the right lateral recess and could impact the right L5 root.  Shallow broad-based central protrusion L5-S1 without stenosis.   Electronically Signed By: Drusilla Kanner M.D. On: 04/07/2019 11:55       PMFS History:     Patient Active Problem List   Diagnosis Date Noted  . Protrusion of lumbar intervertebral disc 04/11/2019  . S/P cervical disc replacement 08/09/2018  . Carpal tunnel syndrome, left upper limb 07/21/2018  . Cervical disc herniation 07/21/2018       Past  Medical History:  Diagnosis Date  . Acid reflux   . Sciatica     Family History  Adopted: Yes         Past Surgical History:  Procedure Laterality Date  . CARPAL TUNNEL RELEASE Left 08/02/2018   Procedure: LEFT CARPAL TUNNEL RELEASE (2nd);  Surgeon: Eldred MangesYates, Clarisa Danser C, MD;  [facility];  Service: Orthopedics;  Laterality: Left;  . CERVICAL DISC ARTHROPLASTY N/A 08/02/2018   Procedure: C3-4 CERVICAL DISC ARTHROPLASTY (1st);  Surgeon: Eldred MangesYates, Martiza Speth C, MD;  [facility];  Service: Orthopedics;  Laterality: N/A;  . TONSILLECTOMY  1985   Social History        Occupational History  . Not on file  Tobacco Use  . Smoking status: Former Smoker    Quit date: 06/27/2017    Years since quitting: 1.8  . Smokeless tobacco: Never Used  Substance and Sexual Activity  . Alcohol use: Yes    Alcohol/week: 8.0 standard drinks    Types: 8 Shots of liquor per week    Comment: social drinking on weekend  . Drug use: Yes    Types: Marijuana  . Sexual activity: Not Currently

## 2019-06-06 NOTE — Anesthesia Procedure Notes (Signed)
Procedure Name: Intubation Date/Time: 06/06/2019 12:47 PM Performed by: Alain Marion, CRNA Pre-anesthesia Checklist: Patient identified, Emergency Drugs available, Suction available and Patient being monitored Patient Re-evaluated:Patient Re-evaluated prior to induction Oxygen Delivery Method: Circle System Utilized Preoxygenation: Pre-oxygenation with 100% oxygen Induction Type: IV induction Ventilation: Mask ventilation without difficulty Laryngoscope Size: Miller and 2 Tube type: Oral Tube size: 7.5 mm Number of attempts: 1 Airway Equipment and Method: Stylet Placement Confirmation: ETT inserted through vocal cords under direct vision,  positive ETCO2 and breath sounds checked- equal and bilateral Secured at: 22 cm Tube secured with: Tape Dental Injury: Teeth and Oropharynx as per pre-operative assessment

## 2019-06-07 ENCOUNTER — Encounter (HOSPITAL_COMMUNITY): Payer: Self-pay | Admitting: Orthopaedic Surgery

## 2019-06-07 DIAGNOSIS — M5116 Intervertebral disc disorders with radiculopathy, lumbar region: Secondary | ICD-10-CM | POA: Diagnosis not present

## 2019-06-07 DIAGNOSIS — K219 Gastro-esophageal reflux disease without esophagitis: Secondary | ICD-10-CM | POA: Diagnosis not present

## 2019-06-07 DIAGNOSIS — Z87891 Personal history of nicotine dependence: Secondary | ICD-10-CM | POA: Diagnosis not present

## 2019-06-07 DIAGNOSIS — M1990 Unspecified osteoarthritis, unspecified site: Secondary | ICD-10-CM | POA: Diagnosis not present

## 2019-06-07 NOTE — Discharge Instructions (Signed)
Walk daily gradually increasing her distance with a goal to reach 2 miles per day after several weeks.  Avoid bending twisting and stooping.  Pain medication and muscle relaxant has been sent into your pharmacy.  Use it sparingly since it can cause nausea and constipation.  You can take ibuprofen or Aleve to help with the pain as well.  See Dr. Lorin Mercy in 1 week.  Your dressing is waterproof and you can take a shower.

## 2019-06-07 NOTE — Progress Notes (Signed)
Patient is discharged from room 3C08 at this time. Alert and in stable condition. IV site d/c'd and instructions read to patient with understanding verbalized. Left unit via wheelchair with all belongings at side. 

## 2019-06-07 NOTE — Progress Notes (Signed)
   Subjective: 1 Day Post-Op Procedure(s) (LRB): RIGHT LUMBAR FOUR-FIVE  MICRODISCECTOMY (N/A) Patient reports pain as mild and moderate.    Objective: Vital signs in last 24 hours: Temp:  [98.1 F (36.7 C)-99.3 F (37.4 C)] 99.3 F (37.4 C) (09/10 0724) Pulse Rate:  [77-92] 82 (09/10 0724) Resp:  [13-20] 16 (09/10 0724) BP: (126-142)/(72-96) 138/94 (09/10 0724) SpO2:  [94 %-100 %] 99 % (09/10 0724) Weight:  [355 kg] 108 kg (09/09 1052)  Intake/Output from previous day: 09/09 0701 - 09/10 0700 In: 1020 [P.O.:120; I.V.:800; IV Piggyback:100] Out: 50 [Blood:50] Intake/Output this shift: No intake/output data recorded.  No results for input(s): HGB in the last 72 hours. No results for input(s): WBC, RBC, HCT, PLT in the last 72 hours. No results for input(s): NA, K, CL, CO2, BUN, CREATININE, GLUCOSE, CALCIUM in the last 72 hours. No results for input(s): LABPT, INR in the last 72 hours.  Neurologically intact Dg Lumbar Spine 2-3 Views  Result Date: 06/06/2019 CLINICAL DATA:  L4-5 microdiscectomy EXAM: LUMBAR SPINE - 2-3 VIEW COMPARISON:  03/14/2019 FINDINGS: Film labeled 1 demonstrates posterior needle directed at the L3-4 level. Second film demonstrates posterior needle directed at L4-5. Third image demonstrates posterior surgical instruments at the L4-5 level. IMPRESSION: Intraoperative imaging as above. Electronically Signed   By: Rolm Baptise M.D.   On: 06/06/2019 19:09    Assessment/Plan: 1 Day Post-Op Procedure(s) (LRB): RIGHT LUMBAR FOUR-FIVE  MICRODISCECTOMY (N/A) Plan:  Discharge , office one week.   [name] 06/07/2019, 7:44 AM

## 2019-06-08 NOTE — Discharge Summary (Addendum)
Patient ID: Paul Huff MRN: [id number] DOB/AGE: 1971-03-03 48 y.o.  Admit date: 06/06/2019 Discharge date: 06/07/19  Admission Diagnoses:  Active Problems:   Protrusion of lumbar intervertebral disc   HNP (herniated nucleus pulposus), lumbar   Discharge Diagnoses:  Active Problems:   Protrusion of lumbar intervertebral disc   HNP (herniated nucleus pulposus), lumbar  status post Procedure(s): RIGHT LUMBAR FOUR-FIVE  MICRODISCECTOMY  Past Medical History:  Diagnosis Date  . Acid reflux   . Arthritis   . Sciatica     Surgeries: Procedure(s): RIGHT LUMBAR FOUR-FIVE  MICRODISCECTOMY on 06/06/2019   Consultants:   Discharged Condition: Improved  Hospital Course: Paul ModenaShane Anthony Yeatman is an 48 y.o. male who was admitted 06/06/2019 for operative treatment of lumbar HNP. Patient failed conservative treatments (please see the history and physical for the specifics) and had severe unremitting pain that affects sleep, daily activities and work/hobbies. After pre-op clearance, the patient was taken to the operating room on 06/06/2019 and underwent  Procedure(s): RIGHT LUMBAR FOUR-FIVE  MICRODISCECTOMY.    Patient was given perioperative antibiotics:  Anti-infectives (From admission, onward)   Start     Dose/Rate Route Frequency Ordered Stop   06/06/19 2100  ceFAZolin (ANCEF) IVPB 1 g/50 mL premix     1 g 100 mL/hr over 30 Minutes Intravenous Every 8 hours 06/06/19 1522 06/07/19 0919   06/06/19 1130  ceFAZolin (ANCEF) IVPB 2g/100 mL premix     2 g 200 mL/hr over 30 Minutes Intravenous To Short Stay 06/05/19 0818 06/06/19 1250       Patient was given sequential compression devices and early ambulation to prevent DVT.   Patient benefited maximally from hospital stay and there were no complications. At the time of discharge, the patient was urinating/moving their bowels without difficulty, tolerating a regular diet, pain is controlled with oral pain medications and they have  been cleared by PT/OT.   Recent vital signs: No data found.   Recent laboratory studies: No results for input(s): WBC, HGB, HCT, PLT, NA, K, CL, CO2, BUN, CREATININE, GLUCOSE, INR, CALCIUM in the last 72 hours.  Invalid input(s): PT, 2   Discharge Medications:   Allergies as of 06/07/2019   No Known Allergies     Medication List    STOP taking these medications   ibuprofen 600 MG tablet Commonly known as: ADVIL   traMADol 50 MG tablet Commonly known as: ULTRAM     TAKE these medications   methocarbamol 500 MG tablet Commonly known as: Robaxin Take 1 tablet (500 mg total) by mouth every 6 (six) hours as needed for muscle spasms.   oxyCODONE-acetaminophen 5-325 MG tablet Commonly known as: PERCOCET/ROXICET Take 1-2 tablets by mouth every 6 (six) hours as needed for severe pain.       Diagnostic Studies: Dg Chest 2 View  Result Date: 05/30/2019 CLINICAL DATA:  Preoperative, spine surgery EXAM: CHEST - 2 VIEW COMPARISON:  07/26/2018 FINDINGS: The heart size and mediastinal contours are within normal limits. Both lungs are clear. The visualized skeletal structures are unremarkable. IMPRESSION: No acute abnormality of the lungs. Electronically Signed   By: Lauralyn PrimesAlex  Bibbey M.D.   On: 05/30/2019 12:54   Dg Lumbar Spine 2-3 Views  Result Date: 06/06/2019 CLINICAL DATA:  L4-5 microdiscectomy EXAM: LUMBAR SPINE - 2-3 VIEW COMPARISON:  03/14/2019 FINDINGS: Film labeled 1 demonstrates posterior needle directed at the L3-4 level. Second film demonstrates posterior needle directed at L4-5. Third image demonstrates posterior surgical instruments at the L4-5 level. IMPRESSION:  Intraoperative imaging as above. Electronically Signed   By: Rolm Baptise M.D.   On: 06/06/2019 19:09      Follow-up Information    [name], MD. Schedule an appointment as soon as possible for a visit in 1 week(s).   Specialty: Orthopedic Surgery Why: need return office visit one week postop Contact  information: [place] [address] [phone]           Discharge Plan:  discharge to home  Disposition:     Signed: Benjiman Core  06/08/2019, 2:36 PM

## 2019-06-09 NOTE — Anesthesia Postprocedure Evaluation (Signed)
Anesthesia Post Note  Patient: Paul Huff  Procedure(s) Performed: RIGHT LUMBAR FOUR-FIVE  MICRODISCECTOMY (N/A Spine Lumbar)     Patient location during evaluation: PACU Anesthesia Type: General Level of consciousness: awake and alert Pain management: pain level controlled Vital Signs Assessment: post-procedure vital signs reviewed and stable Respiratory status: spontaneous breathing, nonlabored ventilation, respiratory function stable and patient connected to nasal cannula oxygen Cardiovascular status: blood pressure returned to baseline and stable Postop Assessment: no apparent nausea or vomiting Anesthetic complications: no    Last Vitals:  Vitals:   06/07/19 0351 06/07/19 0724  BP: 133/72 (!) 138/94  Pulse: 80 82  Resp: 20 16  Temp: 37.3 C 37.4 C  SpO2: 99% 99%    Last Pain:  Vitals:   06/07/19 1019  TempSrc:   PainSc: [facility]

## 2019-06-14 ENCOUNTER — Ambulatory Visit (INDEPENDENT_AMBULATORY_CARE_PROVIDER_SITE_OTHER): Payer: BC Managed Care – PPO | Admitting: Surgery

## 2019-06-14 ENCOUNTER — Encounter: Payer: Self-pay | Admitting: Surgery

## 2019-06-14 VITALS — BP 141/100 | HR 71

## 2019-06-14 DIAGNOSIS — Z9889 Other specified postprocedural states: Secondary | ICD-10-CM

## 2019-06-14 MED ORDER — OXYCODONE-ACETAMINOPHEN 5-325 MG PO TABS: 1.0000 | ORAL_TABLET | Freq: Three times a day (TID) | ORAL | 0 refills | Status: DC | PRN

## 2019-06-14 MED ORDER — METHOCARBAMOL 500 MG PO TABS: 500.0000 mg | ORAL_TABLET | Freq: Three times a day (TID) | ORAL | 0 refills | Status: DC | PRN

## 2019-06-14 NOTE — Progress Notes (Signed)
   Post-Op Visit Note   Patient: Paul Huff           Date of Birth: 1971/06/27           MRN: [id number] Visit Date: 06/14/2019 PCP: Dorothyann Peng, NP   Assessment & Plan:  Chief Complaint:  Chief Complaint  Patient presents with   Lower Back - Routine Post Op  Patient returns.  Status post right L4-5 microdiscectomy June 06, 2019. Visit Diagnoses:  1. Status post lumbar microdiscectomy     Plan: Patient doing well.  Follow-up in 6 days for wound check and possible staple movable.  Follow-Up Instructions: Return in about 6 days (around 06/20/2019) for wound check and staple removal.   Orders:  No orders of the defined types were placed in this encounter.  Meds ordered this encounter  Medications   oxyCODONE-acetaminophen (PERCOCET/ROXICET) 5-325 MG tablet    Sig: Take 1 tablet by mouth every 8 (eight) hours as needed for severe pain.    Dispense:  40 tablet    Refill:  0   methocarbamol (ROBAXIN) 500 MG tablet    Sig: Take 1 tablet (500 mg total) by mouth every 8 (eight) hours as needed for muscle spasms.    Dispense:  50 tablet    Refill:  0    Imaging: No results found.  PMFS History: Patient Active Problem List   Diagnosis Date Noted   HNP (herniated nucleus pulposus), lumbar 06/06/2019   Protrusion of lumbar intervertebral disc 04/11/2019   S/P cervical disc replacement 08/09/2018   Carpal tunnel syndrome, left upper limb 07/21/2018   Cervical disc herniation 07/21/2018   Past Medical History:  Diagnosis Date   Acid reflux    Arthritis    Sciatica     Family History  Adopted: Yes    Past Surgical History:  Procedure Laterality Date   CARPAL TUNNEL RELEASE Left 08/02/2018   Procedure: LEFT CARPAL TUNNEL RELEASE (2nd);  Surgeon: [name], MD;  Location: [facility];  Service: Orthopedics;  Laterality: Left;   CERVICAL DISC ARTHROPLASTY N/A 08/02/2018   Procedure: C3-4 CERVICAL DISC ARTHROPLASTY (1st);  Surgeon: [name], MD;   Location: [facility];  Service: Orthopedics;  Laterality: N/A;   LUMBAR LAMINECTOMY N/A 06/06/2019   Procedure: RIGHT LUMBAR FOUR-FIVE  MICRODISCECTOMY;  Surgeon: [name], MD;  Location: [facility];  Service: Orthopedics;  Laterality: N/A;   TONSILLECTOMY  1985   Social History   Occupational History   Not on file  Tobacco Use   Smoking status: Former Smoker    Quit date: 06/27/2017    Years since quitting: 1.9   Smokeless tobacco: Never Used  Substance and Sexual Activity   Alcohol use: Yes    Alcohol/week: 8.0 standard drinks    Types: 8 Shots of liquor per week    Comment: social drinking on weekend   Drug use: Yes    Types: Marijuana    Comment: 05/29/2019 last mariguana   Sexual activity: Not Currently

## 2019-06-21 ENCOUNTER — Ambulatory Visit (INDEPENDENT_AMBULATORY_CARE_PROVIDER_SITE_OTHER): Payer: BC Managed Care – PPO | Admitting: Surgery

## 2019-06-21 ENCOUNTER — Other Ambulatory Visit: Payer: Self-pay

## 2019-06-21 ENCOUNTER — Encounter: Payer: Self-pay | Admitting: Surgery

## 2019-06-21 DIAGNOSIS — Z9889 Other specified postprocedural states: Secondary | ICD-10-CM

## 2019-06-21 NOTE — Progress Notes (Signed)
48 year old black male who is two-week status post right L4-5 microdiscectomy returns for wound check and staple removal.  States that he is doing well.  He is feeling much better than last office visit.  Does not need refill of medications.  States that he is trying to wean himself off of pain meds.    Exam Pleasant male alert and oriented in no acute distress.  Today staples removed and Steri-Strips applied.  Incision healing well without signs of infection.   Plan Follow-up in 4 weeks for recheck.  Still must avoid bending, twisting, lifting.  Return sooner if needed.

## 2019-06-28 ENCOUNTER — Telehealth: Payer: Self-pay | Admitting: Orthopaedic Surgery

## 2019-06-28 NOTE — Telephone Encounter (Signed)
I talked to patient and he was quite argumentative about [place] having dropped the ball saying the [employer] has faxed his form twice and has confirmation of it. I told him no ball has been dropped, we haven't received the forms. I told him I get confirmations all the time and often have to re fax. I gave him both our fax lines. I also told him he could come in and sign a release and we can fax records to [employer]. He continued to be argumentative and hung up the phone.

## 2019-06-28 NOTE — Telephone Encounter (Signed)
Pt called in about [employer] disability forms that his employer faxed over to [place] and I did explain that we probably needed additional information including money, and the form filled out for [employer] and patient started yelling saying he wanted to talked to someone who works with this and I told him I could transfer him and he started cussing so I did explain to the patient if he kept using profanity I would end the call and patient got loud and continued using profanity so call was ended.

## 2019-06-28 NOTE — Telephone Encounter (Signed)
Received voice mail from pt stating [employer] has sent [place] paperwork and that he has "reached out to [place] multiple times" with no response. This first and only voice mail was received this am at 8:30. I tried to call pt back to advised no paperwork has been received and that he will need to follow up with [employer]. They can resend it her or to [employer] which is whom we use for form completion and he has been made aware of this previously when he was rude to [place] earlier.  When IC pt it rang and no voicemail came on so I was unable to lve msg.

## 2019-07-19 ENCOUNTER — Ambulatory Visit (INDEPENDENT_AMBULATORY_CARE_PROVIDER_SITE_OTHER): Payer: BC Managed Care – PPO | Admitting: Surgery

## 2019-07-19 ENCOUNTER — Encounter: Payer: Self-pay | Admitting: Surgery

## 2019-07-19 ENCOUNTER — Other Ambulatory Visit: Payer: Self-pay

## 2019-07-19 ENCOUNTER — Ambulatory Visit: Payer: Self-pay

## 2019-07-19 VITALS — BP 123/86 | HR 70 | Ht 71.0 in | Wt 238.0 lb

## 2019-07-19 DIAGNOSIS — Z9889 Other specified postprocedural states: Secondary | ICD-10-CM

## 2019-07-19 DIAGNOSIS — M533 Sacrococcygeal disorders, not elsewhere classified: Secondary | ICD-10-CM

## 2019-07-19 MED ORDER — HYDROCODONE-ACETAMINOPHEN 5-325 MG PO TABS: 1.0000 | ORAL_TABLET | Freq: Three times a day (TID) | ORAL | 0 refills | Status: DC | PRN

## 2019-07-19 NOTE — Progress Notes (Signed)
Office Visit Note   Patient: Paul Huff           Date of Birth: [birth year]           MRN: [id number] Visit Date: 07/19/2019              Requested by: Dorothyann Peng, NP [address],  [place] [address] PCP: Dorothyann Peng, NP   Assessment & Plan: Visit Diagnoses:  1. Status post lumbar microdiscectomy   2. Sacral back pain     Plan: Reviewed x-rays with patient.  I recommend that he continue out of work at least another couple of weeks and note was given.  Given 1 more refill for Norco 5/325.  Follow with me in 2 weeks for recheck and we will discuss return back to work status at that point.  Still no aggressive or strenuous activity.  Advised him that he could possibly have a stress fracture of the sacrum from the hard fall that he suffered this past weekend.  Follow-Up Instructions: Return in about 2 weeks (around 08/02/2019) for with Tige Meas.   Orders:  Orders Placed This Encounter  Procedures  . XR Lumbar Spine 2-3 Views  . XR Sacrum/Coccyx   Meds ordered this encounter  Medications  . HYDROcodone-acetaminophen (NORCO/VICODIN) 5-325 MG tablet    Sig: Take 1 tablet by mouth every 8 (eight) hours as needed for moderate pain.    Dispense:  40 tablet    Refill:  0      Procedures: No procedures performed   Clinical Data: No additional findings.   Subjective: Chief Complaint  Patient presents with  . Lower Back - Follow-up    HPI 48 year old black male who is status post right L4-5 microdiscectomy by Dr. Lorin Mercy June 06, 2019 returns.  States that he was doing well up until this past Saturday when he was at an event and he slipped on a wet floor with a hard direct impact to his sacral region.  He continues to have pain in this area and states has had minimal improvement.  No complaints of lower extremity radicular symptoms.  Sacral pain aggravated when he is sitting, ambulating.  No complaints of bowel or bladder issues.   Objective:  Vital Signs: BP 123/86   Pulse 70   Ht 5\' 11"  (1.803 m)   Wt 238 lb (108 kg)   BMI 33.19 kg/m   Physical Exam HENT:     Head: Normocephalic and atraumatic.  Musculoskeletal:     Comments: Exam patient is tender over the sacrum.  Negative log about his.  Neuro vas intact.  No focal motor deficits.  Gait is antalgic.  Patient also has discomfort standing from a chair and also sitting.  Skin:    General: Skin is warm and dry.  Neurological:     General: No focal deficit present.     Mental Status: He is alert and oriented to person, place, and time.     Ortho Exam  Specialty Comments:  No specialty comments available.  Imaging: No results found.   PMFS History: Patient Active Problem List   Diagnosis Date Noted  . HNP (herniated nucleus pulposus), lumbar 06/06/2019  . Protrusion of lumbar intervertebral disc 04/11/2019  . S/P cervical disc replacement 08/09/2018  . Carpal tunnel syndrome, left upper limb 07/21/2018  . Cervical disc herniation 07/21/2018   Past Medical History:  Diagnosis Date  . Acid reflux   . Arthritis   . Sciatica  Family History  Adopted: Yes    Past Surgical History:  Procedure Laterality Date  . CARPAL TUNNEL RELEASE Left 08/02/2018   Procedure: LEFT CARPAL TUNNEL RELEASE (2nd);  Surgeon: [name], MD;  Location: [facility] OR;  Service: Orthopedics;  Laterality: Left;  . CERVICAL DISC ARTHROPLASTY N/A 08/02/2018   Procedure: C3-4 CERVICAL DISC ARTHROPLASTY (1st);  Surgeon: [name], MD;  Location: [facility] OR;  Service: Orthopedics;  Laterality: N/A;  . LUMBAR LAMINECTOMY N/A 06/06/2019   Procedure: RIGHT LUMBAR FOUR-FIVE  MICRODISCECTOMY;  Surgeon: [name], MD;  Location: [facility] OR;  Service: Orthopedics;  Laterality: N/A;  . TONSILLECTOMY  1985   Social History   Occupational History  . Not on file  Tobacco Use  . Smoking status: Former Smoker    Quit date: 06/27/2017    Years since quitting: 2.0  . Smokeless tobacco: Never Used   Substance and Sexual Activity  . Alcohol use: Yes    Alcohol/week: 8.0 standard drinks    Types: 8 Shots of liquor per week    Comment: social drinking on weekend  . Drug use: Yes    Types: Marijuana    Comment: 05/29/2019 last mariguana  . Sexual activity: Not Currently

## 2019-08-02 ENCOUNTER — Other Ambulatory Visit: Payer: Self-pay

## 2019-08-02 ENCOUNTER — Encounter: Payer: Self-pay | Admitting: Surgery

## 2019-08-02 ENCOUNTER — Ambulatory Visit (INDEPENDENT_AMBULATORY_CARE_PROVIDER_SITE_OTHER): Payer: BC Managed Care – PPO | Admitting: Surgery

## 2019-08-02 DIAGNOSIS — Z9889 Other specified postprocedural states: Secondary | ICD-10-CM

## 2019-08-02 NOTE — Progress Notes (Signed)
48 year old black male who is status post right L4-5 microdiscectomy June 06, 2019 returns.  States that he is doing well.  Not having any issues.  He is pleased with his surgical result.  Sacral pain from previous fall has resolved.  He is ready to return back to work tomorrow regular duty.  Exam Pleasant black male alert and oriented in no acute distress.  Gait is normal.  Negative log roll.  Negative straight leg raise.  No focal motor deficits.   Plan Patient doing well.  Can return back to work full duty tomorrow.  Follow-up as needed.  Let us know if there are any problems or issues.

## 2019-08-03 ENCOUNTER — Telehealth: Payer: Self-pay | Admitting: Surgery

## 2019-08-03 NOTE — Telephone Encounter (Signed)
Patient called needing a note for his employer to return back to work without restrictions. Patient said he need to get the letter today. Patient said he need to return to work today. The number to contact patient is [phone]

## 2019-08-03 NOTE — Telephone Encounter (Signed)
Per office note from [facility] [employer] [facility] yesterday, patient may resume regular duty work with no restrictions. Note entered and placed at front desk for pick up. I left voicemail for patient advising.

## 2020-01-19 ENCOUNTER — Ambulatory Visit: Payer: BC Managed Care – PPO | Attending: Internal Medicine

## 2020-01-19 DIAGNOSIS — Z23 Encounter for immunization: Secondary | ICD-10-CM

## 2020-01-19 NOTE — Progress Notes (Signed)
   Covid-19 Vaccination Clinic  Name:  Paul Huff    MRN: [id number] DOB: October 16, 1970  01/19/2020  Paul Huff was observed post Covid-19 immunization for 15 minutes without incident. He was provided with Vaccine Information Sheet and instruction to access the V-Safe system.   Paul Huff was instructed to call 911 with any severe reactions post vaccine: Marland Kitchen Difficulty breathing  . Swelling of face and throat  . A fast heartbeat  . A bad rash all over body  . Dizziness and weakness   Immunizations Administered    Name Date Dose VIS Date Route   Pfizer COVID-19 Vaccine 01/19/2020 11:51 AM 0.3 mL 11/21/2018 Intramuscular   Manufacturer: [employer], [employer]   Lot: [id number]   NDC: [address]-1

## 2020-01-21 ENCOUNTER — Other Ambulatory Visit: Payer: Self-pay

## 2020-01-22 ENCOUNTER — Ambulatory Visit (INDEPENDENT_AMBULATORY_CARE_PROVIDER_SITE_OTHER): Payer: BC Managed Care – PPO | Admitting: Adult Health

## 2020-01-22 ENCOUNTER — Encounter: Payer: Self-pay | Admitting: Adult Health

## 2020-01-22 VITALS — BP 122/86 | Temp 98.5°F | Ht 70.0 in | Wt 244.0 lb

## 2020-01-22 DIAGNOSIS — E782 Mixed hyperlipidemia: Secondary | ICD-10-CM | POA: Diagnosis not present

## 2020-01-22 DIAGNOSIS — Z125 Encounter for screening for malignant neoplasm of prostate: Secondary | ICD-10-CM

## 2020-01-22 DIAGNOSIS — Z0000 Encounter for general adult medical examination without abnormal findings: Secondary | ICD-10-CM

## 2020-01-22 LAB — CBC WITH DIFFERENTIAL/PLATELET
Basophils Absolute: 0.1 10*3/uL (ref 0.0–0.1)
Basophils Relative: 0.7 % (ref 0.0–3.0)
Eosinophils Absolute: 0.3 10*3/uL (ref 0.0–0.7)
Eosinophils Relative: 2.9 % (ref 0.0–5.0)
HCT: 43.8 % (ref 39.0–52.0)
Hemoglobin: 14.6 g/dL (ref 13.0–17.0)
Lymphocytes Relative: 24.7 % (ref 12.0–46.0)
Lymphs Abs: 2.6 10*3/uL (ref 0.7–4.0)
MCHC: 33.3 g/dL (ref 30.0–36.0)
MCV: 85.6 fl (ref 78.0–100.0)
Monocytes Absolute: 0.9 10*3/uL (ref 0.1–1.0)
Monocytes Relative: 8.6 % (ref 3.0–12.0)
Neutro Abs: 6.7 10*3/uL (ref 1.4–7.7)
Neutrophils Relative %: 63.1 % (ref 43.0–77.0)
Platelets: 308 10*3/uL (ref 150.0–400.0)
RBC: 5.12 Mil/uL (ref 4.22–5.81)
RDW: 13.8 % (ref 11.5–15.5)
WBC: 10.7 10*3/uL — ABNORMAL HIGH (ref 4.0–10.5)

## 2020-01-22 LAB — COMPREHENSIVE METABOLIC PANEL
ALT: 24 U/L (ref 0–53)
AST: 17 U/L (ref 0–37)
Albumin: 4.2 g/dL (ref 3.5–5.2)
Alkaline Phosphatase: 64 U/L (ref 39–117)
BUN: 10 mg/dL (ref 6–23)
CO2: 30 mEq/L (ref 19–32)
Calcium: 9.3 mg/dL (ref 8.4–10.5)
Chloride: 102 mEq/L (ref 96–112)
Creatinine, Ser: 0.92 mg/dL (ref 0.40–1.50)
GFR: 87.47 mL/min (ref >60.00)
Glucose, Bld: 89 mg/dL (ref 70–99)
Potassium: 4.3 mEq/L (ref 3.5–5.1)
Sodium: 140 mEq/L (ref 135–145)
Total Bilirubin: 0.5 mg/dL (ref 0.2–1.2)
Total Protein: 6.4 g/dL (ref 6.0–8.3)

## 2020-01-22 LAB — LIPID PANEL
Cholesterol: 201 mg/dL — ABNORMAL HIGH (ref 0–200)
HDL: 34.6 mg/dL — ABNORMAL LOW (ref >39.00)
LDL Cholesterol: 135 mg/dL — ABNORMAL HIGH (ref 0–99)
NonHDL: 166.52
Total CHOL/HDL Ratio: 6
Triglycerides: 159 mg/dL — ABNORMAL HIGH (ref 0.0–149.0)
VLDL: 31.8 mg/dL (ref 0.0–40.0)

## 2020-01-22 LAB — PSA: PSA: 0.38 ng/mL (ref 0.10–4.00)

## 2020-01-22 LAB — TSH: TSH: 1.62 u[IU]/mL (ref 0.35–4.50)

## 2020-01-22 NOTE — Patient Instructions (Signed)
It was great seeing you today   Please continue to work on diet and exercise to lose weight.   We will follow up with you regarding your blood work

## 2020-01-22 NOTE — Progress Notes (Signed)
Subjective:    Patient ID: Paul Huff, male    DOB: Oct 31, 1970, 49 y.o.   MRN: [id number]  HPI Patient presents for yearly preventative medicine examination. He is a pleasant 49 year old male who  has a past medical history of Acid reflux, Arthritis, and Sciatica.  Hyperlipidemia - not currently prescribed medications.  Lab Results  Component Value Date   CHOL 189 11/30/2018   HDL 33.30 (L) 11/30/2018   LDLCALC 135 (H) 11/30/2018   TRIG 102.0 11/30/2018   CHOLHDL 6 11/30/2018    All immunizations and health maintenance protocols were reviewed with the patient and needed orders were placed. He is up to date on routine vaccinations   Appropriate screening laboratory values were ordered for the patient including screening of hyperlipidemia, renal function and hepatic function.  Medication reconciliation,  past medical history, social history, problem list and allergies were reviewed in detail with the patient  Goals were established with regard to weight loss, exercise, and  diet in compliance with medications. He is walking and taking the stairs at work. He is also using tension bands for weight lifting. Reports that he is slowly moving towards a plant based diet   Wt Readings from Last 3 Encounters:  01/22/20 244 lb (110.7 kg)  07/19/19 238 lb (108 kg)  06/06/19 238 lb (108 kg)   Review of Systems  Constitutional: Negative.   HENT: Negative.   Eyes: Negative.   Respiratory: Negative.   Cardiovascular: Negative.   Gastrointestinal: Negative.   Endocrine: Negative.   Genitourinary: Negative.   Musculoskeletal: Negative.   Skin: Negative.   Allergic/Immunologic: Negative.   Neurological: Negative.   Hematological: Negative.   Psychiatric/Behavioral: Negative.   All other systems reviewed and are negative.  Past Medical History:  Diagnosis Date  . Acid reflux   . Arthritis   . Sciatica     Social History   Socioeconomic History  . Marital status:  Married    Spouse name: Not on file  . Number of children: Not on file  . Years of education: Not on file  . Highest education level: Not on file  Occupational History  . Not on file  Tobacco Use  . Smoking status: Former Smoker    Quit date: 06/27/2017    Years since quitting: 2.5  . Smokeless tobacco: Never Used  Substance and Sexual Activity  . Alcohol use: Yes    Alcohol/week: 8.0 standard drinks    Types: 8 Shots of liquor per week    Comment: social drinking on weekend  . Drug use: Yes    Types: Marijuana    Comment: 05/29/2019 last mariguana  . Sexual activity: Not Currently  Other Topics Concern  . Not on file  Social History Narrative   Administrator, Civil Service    Married    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    Social Determinants of Health   Financial Resource Strain:   . Difficulty of Paying Living Expenses:   Food Insecurity:   . Worried About Programme researcher, broadcasting/film/video in the Last Year:   . Barista in the Last Year:   Transportation Needs:   . Freight forwarder (Medical):   Marland Kitchen Lack of Transportation (Non-Medical):   Physical Activity:   . Days of Exercise per Week:   . Minutes of Exercise per Session:   Stress:   . Feeling of Stress :  Social Connections:   . Frequency of Communication with Friends and Family:   . Frequency of Social Gatherings with Friends and Family:   . Attends Religious Services:   . Active Member of Clubs or Organizations:   . Attends Banker Meetings:   Marland Kitchen Marital Status:   Intimate Partner Violence:   . Fear of Current or Ex-Partner:   . Emotionally Abused:   Marland Kitchen Physically Abused:   . Sexually Abused:     Past Surgical History:  Procedure Laterality Date  . CARPAL TUNNEL RELEASE Left 08/02/2018   Procedure: LEFT CARPAL TUNNEL RELEASE (2nd);  Surgeon: [name], MD;  Location: [facility] OR;  Service: Orthopedics;  Laterality: Left;  . CERVICAL DISC ARTHROPLASTY N/A 08/02/2018    Procedure: C3-4 CERVICAL DISC ARTHROPLASTY (1st);  Surgeon: [name], MD;  Location[facility] OR;  Service: Orthopedics;  Laterality: N/A;  . LUMBAR LAMINECTOMY N/A 06/06/2019   Procedure: RIGHT LUMBAR FOUR-FIVE  MICRODISCECTOMY;  Surgeon: [name], MD;  Location: [facility] OR;  Service: Orthopedics;  Laterality: N/A;  . TONSILLECTOMY  1985    Family History  Adopted: Yes    No Known Allergies  No current outpatient medications on file prior to visit.   No current facility-administered medications on file prior to visit.    BP 122/86   Temp 98.5 F (36.9 C) (Temporal)   Ht 5\' 10"  (1.778 m)   Wt 244 lb (110.7 kg)   BMI 35.01 kg/m       Objective:   Physical Exam Vitals and nursing note reviewed.  Constitutional:      General: He is not in acute distress.    Appearance: Normal appearance. He is well-developed. He is obese.  HENT:     Head: Normocephalic and atraumatic.     Right Ear: Tympanic membrane, ear canal and external ear normal. There is no impacted cerumen.     Left Ear: Tympanic membrane, ear canal and external ear normal. There is no impacted cerumen.     Nose: Nose normal. No congestion or rhinorrhea.     Mouth/Throat:     Mouth: Mucous membranes are moist.     Pharynx: Oropharynx is clear. No oropharyngeal exudate or posterior oropharyngeal erythema.  Eyes:     General:        Right eye: No discharge.        Left eye: No discharge.     Extraocular Movements: Extraocular movements intact.     Conjunctiva/sclera: Conjunctivae normal.     Pupils: Pupils are equal, round, and reactive to light.  Neck:     Vascular: No carotid bruit.     Trachea: No tracheal deviation.  Cardiovascular:     Rate and Rhythm: Normal rate and regular rhythm.     Pulses: Normal pulses.     Heart sounds: Normal heart sounds. No murmur. No friction rub. No gallop.   Pulmonary:     Effort: Pulmonary effort is normal. No respiratory distress.     Breath sounds: Normal breath sounds.  No stridor. No wheezing, rhonchi or rales.  Chest:     Chest wall: No tenderness.  Abdominal:     General: Bowel sounds are normal. There is no distension.     Palpations: Abdomen is soft. There is no mass.     Tenderness: There is no abdominal tenderness. There is no right CVA tenderness, left CVA tenderness, guarding or rebound.     Hernia: No hernia is present.  Musculoskeletal:  General: No swelling, tenderness, deformity or signs of injury. Normal range of motion.     Right lower leg: No edema.     Left lower leg: No edema.  Lymphadenopathy:     Cervical: No cervical adenopathy.  Skin:    General: Skin is warm and dry.     Capillary Refill: Capillary refill takes less than 2 seconds.     Coloration: Skin is not jaundiced or pale.     Findings: No bruising, erythema, lesion or rash.  Neurological:     General: No focal deficit present.     Mental Status: He is alert and oriented to person, place, and time.     Cranial Nerves: No cranial nerve deficit.     Sensory: No sensory deficit.     Motor: No weakness.     Coordination: Coordination normal.     Gait: Gait normal.     Deep Tendon Reflexes: Reflexes normal.  Psychiatric:        Mood and Affect: Mood normal.        Behavior: Behavior normal.        Thought Content: Thought content normal.        Judgment: Judgment normal.       Assessment & Plan:  1. Routine general medical examination at a health care facility - Encouraged to work on diet and exercise to help lose weight. Would like him at 220 lbs by next year - CBC with Differential/Platelet - Comprehensive metabolic panel - Lipid panel - TSH  2. Prostate cancer screening  - PSA  3. Mixed hyperlipidemia - Consider statin  - CBC with Differential/Platelet - Comprehensive metabolic panel - Lipid panel - TSH  Dorothyann Peng, NP

## 2020-01-23 MED ORDER — ROSUVASTATIN CALCIUM 5 MG PO TABS: 5.0000 mg | ORAL_TABLET | Freq: Every day | ORAL | 3 refills | Status: DC

## 2020-01-23 NOTE — Addendum Note (Signed)
Addended by: Waymon Amato R on: 01/23/2020 09:12 AM   Modules accepted: Orders

## 2020-02-11 ENCOUNTER — Ambulatory Visit: Payer: BC Managed Care – PPO | Attending: Internal Medicine

## 2020-02-11 DIAGNOSIS — Z23 Encounter for immunization: Secondary | ICD-10-CM

## 2020-02-11 NOTE — Progress Notes (Signed)
   Covid-19 Vaccination Clinic  Name:  Paul Huff    MRN: [id number] DOB: 09/25/1971  02/11/2020  Paul Huff was observed post Covid-19 immunization for 15 minutes without incident. He was provided with Vaccine Information Sheet and instruction to access the V-Safe system.   Paul Huff was instructed to call 911 with any severe reactions post vaccine: Marland Kitchen Difficulty breathing  . Swelling of face and throat  . A fast heartbeat  . A bad rash all over body  . Dizziness and weakness   Immunizations Administered    Name Date Dose VIS Date Route   Pfizer COVID-19 Vaccine 02/11/2020  8:29 AM 0.3 mL 11/21/2018 Intramuscular   Manufacturer: [employer], [employer]   Lot: [id number]   NDC: [address]-9

## 2020-07-09 ENCOUNTER — Ambulatory Visit (INDEPENDENT_AMBULATORY_CARE_PROVIDER_SITE_OTHER): Payer: BC Managed Care – PPO | Admitting: Orthopaedic Surgery

## 2020-07-09 ENCOUNTER — Encounter: Payer: Self-pay | Admitting: Orthopaedic Surgery

## 2020-07-09 ENCOUNTER — Ambulatory Visit: Payer: Self-pay

## 2020-07-09 VITALS — BP 146/85 | HR 64 | Ht 71.0 in | Wt 233.0 lb

## 2020-07-09 DIAGNOSIS — G8929 Other chronic pain: Secondary | ICD-10-CM

## 2020-07-09 DIAGNOSIS — G5602 Carpal tunnel syndrome, left upper limb: Secondary | ICD-10-CM

## 2020-07-09 DIAGNOSIS — M25512 Pain in left shoulder: Secondary | ICD-10-CM

## 2020-07-09 DIAGNOSIS — Z9889 Other specified postprocedural states: Secondary | ICD-10-CM | POA: Diagnosis not present

## 2020-07-09 DIAGNOSIS — M7542 Impingement syndrome of left shoulder: Secondary | ICD-10-CM | POA: Diagnosis not present

## 2020-07-09 MED ORDER — BUPIVACAINE HCL 0.25 % IJ SOLN
4.0000 mL | INTRAMUSCULAR | Status: AC | PRN
  Administered 2020-07-09: 4 mL via INTRA_ARTICULAR

## 2020-07-09 MED ORDER — LIDOCAINE HCL 1 % IJ SOLN
0.5000 mL | INTRAMUSCULAR | Status: AC | PRN
  Administered 2020-07-09: .5 mL

## 2020-07-09 NOTE — Progress Notes (Signed)
Office Visit Note   Patient: Paul Huff           Date of Birth: 1970-10-21           MRN: [id number] Visit Date: 07/09/2020              Requested by: Shirline Frees, NP [address] [place],  [place] [address] PCP: Shirline Frees, NP   Assessment & Plan: Visit Diagnoses:  1. Chronic left shoulder pain   2. Impingement syndrome of left shoulder   3. Carpal tunnel syndrome, left upper limb     Plan: Injection performed left subacromial for impingement.  He will call if he has persistent problems.  He still has carpal tunnel in the right hand but he wants to wait a while for he considers carpal tunnel release and see if it progresses.  He still has a splint that he can use at night.  Follow-up as needed.  Follow-Up Instructions: No follow-ups on file.   Orders:  Orders Placed This Encounter  Procedures  . Large Joint Inj: L subacromial bursa  . XR Shoulder Left   No orders of the defined types were placed in this encounter.     Procedures: Large Joint Inj: L subacromial bursa on 07/09/2020 9:06 AM Indications: pain Details: 22 G 1.5 in needle  Arthrogram: No  Medications: 4 mL bupivacaine 0.25 %; 0.5 mL lidocaine 1 % Outcome: tolerated well, no immediate complications Procedure, treatment alternatives, risks and benefits explained, specific risks discussed. Consent was given by the patient. Immediately prior to procedure a time out was called to verify the correct patient, procedure, equipment, support staff and site/side marked as required. Patient was prepped and draped in the usual sterile fashion.       Clinical Data: No additional findings.   Subjective: Chief Complaint  Patient presents with  . Left Shoulder - Pain    HPI 49 year old male returns he had disc arthroplasty at C3-4 and also left carpal tunnel release and is here with left shoulder pain.  He is left-hand dominant.  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  He has had pain anteriorly over the biceps tendon pain with abduction.  No numbness or tingling in the hand good range of motion cervical spine.  He does have some problems with right carpal tunnel syndrome but not severe enough for surgery at this point he relates.  Review of Systems all other systems are negative as they relate to HPI.   Objective: Vital Signs: BP (!) 146/85   Pulse 64   Ht 5\' 11"  (1.803 m)   Wt 233 lb (105.7 kg)   BMI 32.50 kg/m   Physical Exam Constitutional:      Appearance: He is well-developed.  HENT:     Head: Normocephalic and atraumatic.  Eyes:     Pupils: Pupils are equal, round, and reactive to light.  Neck:     Thyroid: No thyromegaly.     Trachea: No tracheal deviation.  Cardiovascular:     Rate and Rhythm: Normal rate.  Pulmonary:     Effort: Pulmonary effort is normal.     Breath sounds: No wheezing.  Abdominal:     General: Bowel sounds are normal.     Palpations: Abdomen is soft.  Skin:    General: Skin  is warm and dry.     Capillary Refill: Capillary refill takes less than 2 seconds.  Neurological:     Mental Status: He is alert and oriented to person, place, and time.  Psychiatric:        Behavior: Behavior normal.        Thought Content: Thought content normal.        Judgment: Judgment normal.     Ortho Exam well-healed cervical incision good range of motion of the cervical spine without discomfort no brachial plexus tenderness.  Long head of the biceps on the left is tender positive impingement left shoulder he can get his arm actively over his head with some discomfort subscap external rotation is normal.  No distal migration of the biceps muscle.  Well-healed carpal tunnel incision on the left.  Some discomfort with carpal compression on the right and Phalen's test on the right. Specialty  Comments:  No specialty comments available.  Imaging: No results found.   PMFS History: Patient Active Problem List   Diagnosis Date Noted  . Impingement syndrome of left shoulder 07/09/2020  . HNP (herniated nucleus pulposus), lumbar 06/06/2019  . Protrusion of lumbar intervertebral disc 04/11/2019  . S/P cervical disc replacement 08/09/2018  . Carpal tunnel syndrome, left upper limb 07/21/2018  . Cervical disc herniation 07/21/2018   Past Medical History:  Diagnosis Date  . Acid reflux   . Arthritis   . Sciatica     Family History  Adopted: Yes    Past Surgical History:  Procedure Laterality Date  . CARPAL TUNNEL RELEASE Left 08/02/2018   Procedure: LEFT CARPAL TUNNEL RELEASE (2nd);  Surgeon: [name], MD;  Location: [facility] OR;  Service: Orthopedics;  Laterality: Left;  . CERVICAL DISC ARTHROPLASTY N/A 08/02/2018   Procedure: C3-4 CERVICAL DISC ARTHROPLASTY (1st);  Surgeon: [name], MD;  Location: [facility] OR;  Service: Orthopedics;  Laterality: N/A;  . LUMBAR LAMINECTOMY N/A 06/06/2019   Procedure: RIGHT LUMBAR FOUR-FIVE  MICRODISCECTOMY;  Surgeon: [name], MD;  Location: [facility] OR;  Service: Orthopedics;  Laterality: N/A;  . TONSILLECTOMY  1985   Social History   Occupational History  . Not on file  Tobacco Use  . Smoking status: Former Smoker    Quit date: 06/27/2017    Years since quitting: 3.0  . Smokeless tobacco: Never Used  Vaping Use  . Vaping Use: Former  . Substances: THC  Substance and Sexual Activity  . Alcohol use: Yes    Alcohol/week: 8.0 standard drinks    Types: 8 Shots of liquor per week    Comment: social drinking on weekend  . Drug use: Yes    Types: Marijuana    Comment: 05/29/2019 last mariguana  . Sexual activity: Not Currently

## 2021-06-16 ENCOUNTER — Other Ambulatory Visit: Payer: Self-pay

## 2021-06-17 ENCOUNTER — Ambulatory Visit (INDEPENDENT_AMBULATORY_CARE_PROVIDER_SITE_OTHER): Payer: BC Managed Care – PPO | Admitting: Adult Health

## 2021-06-17 VITALS — BP 140/90 | HR 66 | Temp 98.3°F | Ht 69.5 in | Wt 258.0 lb

## 2021-06-17 DIAGNOSIS — E782 Mixed hyperlipidemia: Secondary | ICD-10-CM

## 2021-06-17 DIAGNOSIS — E668 Other obesity: Secondary | ICD-10-CM | POA: Diagnosis not present

## 2021-06-17 DIAGNOSIS — Z1211 Encounter for screening for malignant neoplasm of colon: Secondary | ICD-10-CM

## 2021-06-17 DIAGNOSIS — R030 Elevated blood-pressure reading, without diagnosis of hypertension: Secondary | ICD-10-CM | POA: Diagnosis not present

## 2021-06-17 DIAGNOSIS — Z125 Encounter for screening for malignant neoplasm of prostate: Secondary | ICD-10-CM

## 2021-06-17 DIAGNOSIS — Z1159 Encounter for screening for other viral diseases: Secondary | ICD-10-CM

## 2021-06-17 DIAGNOSIS — Z0001 Encounter for general adult medical examination with abnormal findings: Secondary | ICD-10-CM | POA: Diagnosis not present

## 2021-06-17 DIAGNOSIS — Z114 Encounter for screening for human immunodeficiency virus [HIV]: Secondary | ICD-10-CM

## 2021-06-17 NOTE — Patient Instructions (Signed)
It was great seeing you today   We will follow up with you regarding your blood work   Please work on weight loss throughout the year   Monitor your blood pressure at home a couple of times a day and send me your results through [employer]

## 2021-06-17 NOTE — Progress Notes (Signed)
Subjective:    Patient ID: Paul Huff, male    DOB: [birth year], 50 y.o.   MRN: [id number]  HPI Patient presents for yearly preventative medicine examination. He is a pleasant 50 year old male who  has a past medical history of Acid reflux, Arthritis, and Sciatica.  Hyperlipidemia - takes crestor 5 mg daily. Denies mylagia or fatigue. He has not been taking it for the last few months as his prescription ran out.  Lab Results  Component Value Date   CHOL 201 (H) 01/22/2020   HDL 34.60 (L) 01/22/2020   LDLCALC 135 (H) 01/22/2020   TRIG 159.0 (H) 01/22/2020   CHOLHDL 6 01/22/2020    All immunizations and health maintenance protocols were reviewed with the patient and needed orders were placed.  Appropriate screening laboratory values were ordered for the patient including screening of hyperlipidemia, renal function and hepatic function. If indicated by BPH, a PSA was ordered.  Medication reconciliation,  past medical history, social history, problem list and allergies were reviewed in detail with the patient  Goals were established with regard to weight loss, exercise, and  diet in compliance with medications. Has not been exercising much do to chronic arthritic pain. He has been working from home and has put on about 25 pounds over the last year.  Wt Readings from Last 3 Encounters:  06/17/21 258 lb (117 kg)  07/09/20 233 lb (105.7 kg)  01/22/20 244 lb (110.7 kg)   He is due for colon cancer screening   Review of Systems  Constitutional: Negative.   HENT: Negative.    Eyes: Negative.   Respiratory: Negative.    Cardiovascular: Negative.   Gastrointestinal: Negative.   Endocrine: Negative.   Genitourinary: Negative.   Musculoskeletal: Negative.   Skin: Negative.   Allergic/Immunologic: Negative.   Neurological: Negative.   Hematological: Negative.   Psychiatric/Behavioral: Negative.    All other systems reviewed and are negative.  Past Medical History:   Diagnosis Date   Acid reflux    Arthritis    Sciatica     Social History   Socioeconomic History   Marital status: Married    Spouse name: Not on file   Number of children: Not on file   Years of education: Not on file   Highest education level: Not on file  Occupational History   Not on file  Tobacco Use   Smoking status: Former    Types: Cigarettes    Quit date: 06/27/2017    Years since quitting: 3.9   Smokeless tobacco: Never  Vaping Use   Vaping Use: Former   Substances: THC  Substance and Sexual Activity   Alcohol use: Yes    Alcohol/week: 8.0 standard drinks    Types: 8 Shots of liquor per week    Comment: social drinking on weekend   Drug use: Yes    Types: Marijuana    Comment: 05/29/2019 last mariguana   Sexual activity: Not Currently  Other Topics Concern   Not on file  Social History Narrative   Administrator, [employer] Service    Married    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    Social Determinants of Health   Financial Resource Strain: Not on file  Food Insecurity: Not on file  Transportation Needs: Not on file  Physical Activity: Not on file  Stress: Not on file  Social Connections: Not on file  Intimate Partner Violence: Not on  file    Past Surgical History:  Procedure Laterality Date   CARPAL TUNNEL RELEASE Left 08/02/2018   Procedure: LEFT CARPAL TUNNEL RELEASE (2nd);  Surgeon: [name], MD;  Location: [facility] OR;  Service: Orthopedics;  Laterality: Left;   CERVICAL DISC ARTHROPLASTY N/A 08/02/2018   Procedure: C3-4 CERVICAL DISC ARTHROPLASTY (1st);  Surgeon: [name], MD;  Location: [facility] OR;  Service: Orthopedics;  Laterality: N/A;   LUMBAR LAMINECTOMY N/A 06/06/2019   Procedure: RIGHT LUMBAR FOUR-FIVE  MICRODISCECTOMY;  Surgeon: [name], MD;  Location: [facility] OR;  Service: Orthopedics;  Laterality: N/A;   TONSILLECTOMY  1985    Family History  Adopted: Yes    No Known Allergies  Current Outpatient  Medications on File Prior to Visit  Medication Sig Dispense Refill   acetaminophen (TYLENOL) 650 MG CR tablet Take 650 mg by mouth every 8 (eight) hours as needed for pain.     rosuvastatin (CRESTOR) 5 MG tablet Take 1 tablet (5 mg total) by mouth daily. (Patient not taking: Reported on 06/17/2021) 90 tablet 3   No current facility-administered medications on file prior to visit.    BP 140/90 (BP Location: Left Arm, Patient Position: Sitting, Cuff Size: Large)   Pulse 66   Temp 98.3 F (36.8 C) (Oral)   Ht 5' 9.5" (1.765 m)   Wt 258 lb (117 kg)   SpO2 96%   BMI 37.55 kg/m        Objective:   Physical Exam Vitals and nursing note reviewed.  Constitutional:      General: He is not in acute distress.    Appearance: Normal appearance. He is well-developed and normal weight.  HENT:     Head: Normocephalic and atraumatic.     Right Ear: Tympanic membrane, ear canal and external ear normal. There is no impacted cerumen.     Left Ear: Tympanic membrane, ear canal and external ear normal. There is no impacted cerumen.     Nose: Nose normal. No congestion or rhinorrhea.     Mouth/Throat:     Mouth: Mucous membranes are moist.     Pharynx: Oropharynx is clear. No oropharyngeal exudate or posterior oropharyngeal erythema.  Eyes:     General:        Right eye: No discharge.        Left eye: No discharge.     Extraocular Movements: Extraocular movements intact.     Conjunctiva/sclera: Conjunctivae normal.     Pupils: Pupils are equal, round, and reactive to light.  Neck:     Vascular: No carotid bruit.     Trachea: No tracheal deviation.  Cardiovascular:     Rate and Rhythm: Normal rate and regular rhythm.     Pulses: Normal pulses.     Heart sounds: Normal heart sounds. No murmur heard.   No friction rub. No gallop.  Pulmonary:     Effort: Pulmonary effort is normal. No respiratory distress.     Breath sounds: Normal breath sounds. No stridor. No wheezing, rhonchi or rales.   Chest:     Chest wall: No tenderness.  Abdominal:     General: Bowel sounds are normal. There is no distension.     Palpations: Abdomen is soft. There is no mass.     Tenderness: There is no abdominal tenderness. There is no right CVA tenderness, left CVA tenderness, guarding or rebound.     Hernia: No hernia is present.  Musculoskeletal:  General: No swelling, tenderness, deformity or signs of injury. Normal range of motion.     Right lower leg: No edema.     Left lower leg: No edema.  Lymphadenopathy:     Cervical: No cervical adenopathy.  Skin:    General: Skin is warm and dry.     Capillary Refill: Capillary refill takes less than 2 seconds.     Coloration: Skin is not jaundiced or pale.     Findings: No bruising, erythema, lesion or rash.  Neurological:     General: No focal deficit present.     Mental Status: He is alert and oriented to person, place, and time.     Cranial Nerves: No cranial nerve deficit.     Sensory: No sensory deficit.     Motor: No weakness.     Coordination: Coordination normal.     Gait: Gait normal.     Deep Tendon Reflexes: Reflexes normal.  Psychiatric:        Mood and Affect: Mood normal.        Behavior: Behavior normal.        Thought Content: Thought content normal.        Judgment: Judgment normal.       Assessment & Plan:  1.1. Encounter for general adult medical examination with abnormal findings  - CBC with Differential/Platelet; Future - Comprehensive metabolic panel; Future - Hemoglobin A1c; Future - Lipid panel; Future - TSH; Future - PSA; Future  2. Prostate cancer screening  - PSA; Future  3. Mixed hyperlipidemia - Consider increase in statin  - CBC with Differential/Platelet; Future - Comprehensive metabolic panel; Future - Hemoglobin A1c; Future - Lipid panel; Future - TSH; Future - PSA; Future  4. Colon cancer screening  - Cologuard  5. Need for hepatitis C screening test  - Hep C Antibody;  Future  6. Encounter for screening for HIV  - HIV Antibody (routine testing w rflx); Future  7. Other obesity - Needs significant weight loss. Encouraged lifestyle modifications especially since he is working from home now.  - CBC with Differential/Platelet; Future - Comprehensive metabolic panel; Future - Hemoglobin A1c; Future - Lipid panel; Future - TSH; Future  8. Elevated blood pressure reading - Elevated in the office today. Will have him monitor BP at home and send me results in a week. Consider adding lisinopril or norvasc.  - CBC with Differential/Platelet; Future - Comprehensive metabolic panel; Future - Hemoglobin A1c; Future - Lipid panel; Future - TSH; Future  Shirline Frees, NP

## 2021-08-19 ENCOUNTER — Telehealth: Payer: Self-pay

## 2021-08-19 NOTE — Telephone Encounter (Signed)
Received fax from [facility] re: cologuard sample not received. Pt called & encouraged to complete sample. Encouraged to place box in bathroom & read instructions to think about next steps. Educated on importance of colorectal cancer screening. Provided opportunity to ask questions, pt has no questions at this time; just "hesitant" to collect specimen. Pt advised to call office or number on the box if questions arise. Pt verb understanding.

## 2022-01-16 ENCOUNTER — Emergency Department (HOSPITAL_COMMUNITY)
Admission: EM | Admit: 2022-01-16 | Discharge: 2022-01-17 | Disposition: A | Discharge status: Home / Self Care | Payer: BC Managed Care – PPO | Attending: Emergency Medicine | Admitting: Emergency Medicine

## 2022-01-16 DIAGNOSIS — S62347A Nondisplaced fracture of base of fifth metacarpal bone. left hand, initial encounter for closed fracture: Secondary | ICD-10-CM | POA: Diagnosis not present

## 2022-01-16 DIAGNOSIS — M7989 Other specified soft tissue disorders: Secondary | ICD-10-CM | POA: Diagnosis not present

## 2022-01-16 DIAGNOSIS — W228XXA Striking against or struck by other objects, initial encounter: Secondary | ICD-10-CM | POA: Diagnosis not present

## 2022-01-16 DIAGNOSIS — S6992XA Unspecified injury of left wrist, hand and finger(s), initial encounter: Secondary | ICD-10-CM | POA: Diagnosis not present

## 2022-01-16 NOTE — ED Provider Triage Note (Signed)
Emergency Medicine Provider Triage Evaluation Note ? ?Tionne Dayhoff , a 51 y.o. male  was evaluated in triage.  Pt complains of left hand pain.  Patient states he was punching a bag and slipped and punched a piece of metal with his left hand.  Has pain throughout the left hand but states that it is worse towards the fifth metacarpal.  Patient has good movement and sensation.  Brisk cap refill. ? ?Review of Systems  ?Positive: Left hand pain ?Negative:  ? ?Physical Exam  ?BP (!) 161/96 (BP Location: Left Arm)   Pulse 65   Temp 98.1 ?F (36.7 ?C) (Oral)   Resp 16   Ht 5' 10.5" (1.791 m)   Wt 106.6 kg   SpO2 99%   BMI 33.24 kg/m?  ?Gen:   Awake, no distress   ?Resp:  Normal effort  ?MSK:   Moves extremities without difficulty, swelling noted along the medial portion of the left hand ?Other:   ? ?Medical Decision Making  ?Medically screening exam initiated at 11:49 PM.  Appropriate orders placed.  Cabell Lazenby was informed that the remainder of the evaluation will be completed by another provider, this initial triage assessment does not replace that evaluation, and the importance of remaining in the ED until their evaluation is complete. ? ? ?  ?Darrick Grinder, PA-C ?01/16/22 2350 ? ?

## 2022-01-17 ENCOUNTER — Emergency Department (HOSPITAL_COMMUNITY): Payer: BC Managed Care – PPO

## 2022-01-17 ENCOUNTER — Other Ambulatory Visit: Payer: Self-pay

## 2022-01-17 ENCOUNTER — Encounter (HOSPITAL_COMMUNITY): Payer: Self-pay

## 2022-01-17 DIAGNOSIS — S62347A Nondisplaced fracture of base of fifth metacarpal bone. left hand, initial encounter for closed fracture: Secondary | ICD-10-CM | POA: Diagnosis not present

## 2022-01-17 DIAGNOSIS — M7989 Other specified soft tissue disorders: Secondary | ICD-10-CM | POA: Diagnosis not present

## 2022-01-17 MED ORDER — HYDROCODONE-ACETAMINOPHEN 5-325 MG PO TABS
2.0000 | ORAL_TABLET | Freq: Once | ORAL | Status: AC
  Administered 2022-01-17: 2 via ORAL
  Filled 2022-01-17: qty 2

## 2022-01-17 NOTE — Discharge Instructions (Signed)
Take Tylenol or Ibuprofen for pain.  You can apply ice on top of the splint.  You need to follow-up with Dr. Merlyn Lot.  Please call his office on Monday. ?

## 2022-01-17 NOTE — ED Triage Notes (Addendum)
Pt reports with left hand swelling and pain after punching a piece of metal by accident earlier today.  ?

## 2022-01-17 NOTE — ED Notes (Signed)
Discharge instructions discussed with pt. Pt verbalized understanding with no questions at this time. Ortho tech provided education on splint and sling. Pt to follow up with outpt ortho surg.  ?

## 2022-01-17 NOTE — Progress Notes (Signed)
Orthopedic Tech Progress Note ?Patient Details:  ?Willis Modena ?1971/07/29 ?[id number] ? ?Ortho Devices ?Type of Ortho Device: Ulna gutter splint, Arm sling ?Ortho Device/Splint Location: lue ?Ortho Device/Splint Interventions: Ordered, Application, Adjustment ?  ?Post Interventions ?Patient Tolerated: Well ?Instructions Provided: Care of device, Adjustment of device ? ?Trinna Post ?01/17/2022, 3:00 AM ? ?

## 2022-01-17 NOTE — ED Provider Notes (Signed)
?[facility]-EMERGENCY DEPT ?[facility] Emergency Department ?Provider Note ?MRN:  [id number]  ?Arrival date & time: 01/17/22    ? ?Chief Complaint   ?Hand Injury ?  ?History of Present Illness   ?Paul Huff is a 51 y.o. year-old male presents to the ED with chief complaint of left hand pain.  States that he was training (boxing) today and hit a pole instead of the punching bag.  Reports pain with movement and palpation.  Denies successful treatments PTA. ? ?History provided by patient. ? ? ?Review of Systems  ?Pertinent review of systems noted in HPI.  ? ? ?Physical Exam  ? ?Vitals:  ? 01/16/22 2343  ?BP: (!) 161/96  ?Pulse: 65  ?Resp: 16  ?Temp: 98.1 ?F (36.7 ?C)  ?SpO2: 99%  ?  ?CONSTITUTIONAL:  well-appearing, NAD ?NEURO:  Alert and oriented x 3, CN 3-12 grossly intact ?EYES:  eyes equal and reactive ?ENT/NECK:  Supple, no stridor  ?CARDIO:  normal rate, regular rhythm, appears well-perfused  ?PULM:  No respiratory distress,  ?GI/GU:  non-distended,  ?MSK/SPINE:  Moderate amount of swelling over the left 5th metacarpal with TTP and some mild swelling of the wrist ?SKIN:  no rash, atraumatic ? ? ?*Additional and/or pertinent findings included in MDM below ? ?Diagnostic and Interventional Summary  ? ? EKG Interpretation ? ?Date/Time:    ?Ventricular Rate:    ?PR Interval:    ?QRS Duration:   ?QT Interval:    ?QTC Calculation:   ?R Axis:     ?Text Interpretation:   ?  ? ?  ? ?Labs Reviewed - No data to display  ?DG Hand Complete Left  ?Final Result  ?  ?  ?Medications  ?HYDROcodone-acetaminophen (NORCO/VICODIN) 5-325 MG per tablet 2 tablet (has no administration in time range)  ?  ? ?Procedures  /  Critical Care ?Procedures ? ?ED Course and Medical Decision Making  ?I have reviewed the triage vital signs, the nursing notes, and pertinent available records from the EMR. ? ?Complexity of Problems Addressed: ?Moderate Complexity: Acute complicated illness or injury, requiring diagnostic workup as ordered  and performed below. ?Comorbidities affecting this illness/injury include: ?None ?Social Determinants Affecting Care: ?No clinically significant social determinants affecting this chief complaint.. ? ? ?ED Course: ?After considering the following differential, fx vs dislocation from trauma, I agree with orders placed in triage. ?I visualized the left hand x-ray which is notable for fx at the base of the 5th metacarpal and agree with the radiologist interpretation.. ? ?  ? ?Consultants: ?No consultations were needed in caring for this patient. ? ?Treatment and Plan: ?Patient placed in ulnar gutter splint.  Pain treated with norco.  Given follow-up instructions with hand. ? ?Emergency department workup does not suggest an emergent condition requiring admission or immediate intervention beyond  what has been performed at this time. The patient is safe for discharge and has  been instructed to return immediately for worsening symptoms, change in  symptoms or any other concerns ? ?Patient discussed with attending physician, Dr. Bebe Shaggy, who agrees with plan. ? ?Final Clinical Impressions(s) / ED Diagnoses  ? ?  ICD-10-CM   ?1. Closed nondisplaced fracture of base of fifth metacarpal bone of left hand, initial encounter  S62.347A   ?  ?  ?ED Discharge Orders   ? ? None  ? ?  ?  ? ? ?Discharge Instructions Discussed with and Provided to Patient:  ? ? ? ?Discharge Instructions   ? ?  ?Take Tylenol or Ibuprofen  for pain.  You can apply ice on top of the splint.  You need to follow-up with Dr. Merlyn Lot.  Please call his office on Monday. ? ? ? ? ?  ?Roxy Horseman, PA-C ?01/17/22 0100 ? ?  ?Zadie Rhine, MD ?01/17/22 0150 ? ?

## 2022-01-17 NOTE — ED Notes (Signed)
Ortho tech called for placement of left splint  ?

## 2023-07-20 ENCOUNTER — Encounter: Payer: Self-pay | Admitting: Adult Health

## 2023-07-20 ENCOUNTER — Ambulatory Visit (INDEPENDENT_AMBULATORY_CARE_PROVIDER_SITE_OTHER): Payer: BC Managed Care – PPO | Admitting: Adult Health

## 2023-07-20 VITALS — BP 120/82 | HR 63 | Temp 98.6°F | Ht 69.0 in | Wt 250.0 lb

## 2023-07-20 DIAGNOSIS — Z6836 Body mass index (BMI) 36.0-36.9, adult: Secondary | ICD-10-CM | POA: Diagnosis not present

## 2023-07-20 DIAGNOSIS — E66812 Obesity, class 2: Secondary | ICD-10-CM | POA: Diagnosis not present

## 2023-07-20 DIAGNOSIS — Z1211 Encounter for screening for malignant neoplasm of colon: Secondary | ICD-10-CM

## 2023-07-20 DIAGNOSIS — Z0000 Encounter for general adult medical examination without abnormal findings: Secondary | ICD-10-CM | POA: Diagnosis not present

## 2023-07-20 DIAGNOSIS — E782 Mixed hyperlipidemia: Secondary | ICD-10-CM | POA: Diagnosis not present

## 2023-07-20 DIAGNOSIS — Z125 Encounter for screening for malignant neoplasm of prostate: Secondary | ICD-10-CM

## 2023-07-20 NOTE — Progress Notes (Signed)
Subjective:    Patient ID: Paul Huff, male    DOB: [birth year], 52 y.o.   MRN: [id number]  HPI  Patient presents for yearly preventative medicine examination. He is a 52 year old male who  has a past medical history of Acid reflux, Arthritis, and Sciatica.  He was last seen in the office on 06/17/2021  Hyperlipidemia - takes crestor 5 mg daily. Denies mylagia or fatigue when taking it. He has not had the medication in quite some time.  Lab Results  Component Value Date   CHOL 201 (H) 01/22/2020   HDL 34.60 (L) 01/22/2020   LDLCALC 135 (H) 01/22/2020   TRIG 159.0 (H) 01/22/2020   CHOLHDL 6 01/22/2020    All immunizations and health maintenance protocols were reviewed with the patient and needed orders were placed.  Appropriate screening laboratory values were ordered for the patient including screening of hyperlipidemia, renal function and hepatic function. If indicated by BPH, a PSA was ordered.  Medication reconciliation,  past medical history, social history, problem list and allergies were reviewed in detail with the patient  Goals were established with regard to weight loss, exercise, and  diet in compliance with medications. He is eating better overall and has been exercising more at home with resistance exercises, push ups and pull ups.  Wt Readings from Last 3 Encounters:  07/20/23 250 lb (113.4 kg)  01/16/22 235 lb (106.6 kg)  06/17/21 258 lb (117 kg)   He is overdue for colon cancer screening   Review of Systems  Constitutional: Negative.   HENT: Negative.    Eyes: Negative.   Respiratory: Negative.    Cardiovascular: Negative.   Gastrointestinal: Negative.   Endocrine: Negative.   Genitourinary: Negative.   Musculoskeletal: Negative.   Skin: Negative.   Allergic/Immunologic: Negative.   Neurological: Negative.   Hematological: Negative.   Psychiatric/Behavioral: Negative.    All other systems reviewed and are negative.  Past Medical History:   Diagnosis Date   Acid reflux    Arthritis    Sciatica     Social History   Socioeconomic History   Marital status: Married    Spouse name: Not on file   Number of children: Not on file   Years of education: Not on file   Highest education level: Not on file  Occupational History   Not on file  Tobacco Use   Smoking status: Former    Current packs/day: 0.00    Types: Cigarettes    Quit date: 06/27/2017    Years since quitting: 6.0   Smokeless tobacco: Never  Vaping Use   Vaping status: Some Days   Substances: THC  Substance and Sexual Activity   Alcohol use: Yes    Alcohol/week: 7.0 standard drinks of alcohol    Types: 2 Cans of beer, 5 Shots of liquor per week    Comment: social drinking on weekend   Drug use: Yes    Types: Marijuana    Comment: 05/29/2019 last mariguana   Sexual activity: Not Currently  Other Topics Concern   Not on file  Social History Narrative   Administrator, [employer] Service    Married    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    Social Determinants of Health   Financial Resource Strain: Not on file  Food Insecurity: Not on file  Transportation Needs: Not on file  Physical Activity: Not on file  Stress: Not on  file  Social Connections: Not on file  Intimate Partner Violence: Not on file    Past Surgical History:  Procedure Laterality Date   CARPAL TUNNEL RELEASE Left 08/02/2018   Procedure: LEFT CARPAL TUNNEL RELEASE (2nd);  Surgeon: [name], MD;  Location: [facility] OR;  Service: Orthopedics;  Laterality: Left;   CERVICAL DISC ARTHROPLASTY N/A 08/02/2018   Procedure: C3-4 CERVICAL DISC ARTHROPLASTY (1st);  Surgeon: [name], MD;  Location: [facility] OR;  Service: Orthopedics;  Laterality: N/A;   LUMBAR LAMINECTOMY N/A 06/06/2019   Procedure: RIGHT LUMBAR FOUR-FIVE  MICRODISCECTOMY;  Surgeon: [name], MD;  Location: [facility] OR;  Service: Orthopedics;  Laterality: N/A;   TONSILLECTOMY  1985    Family History   Adopted: Yes    No Known Allergies  Current Outpatient Medications on File Prior to Visit  Medication Sig Dispense Refill   acetaminophen (TYLENOL) 650 MG CR tablet Take 650 mg by mouth every 8 (eight) hours as needed for pain.     rosuvastatin (CRESTOR) 5 MG tablet Take 1 tablet (5 mg total) by mouth daily. (Patient not taking: Reported on 06/17/2021) 90 tablet 3   No current facility-administered medications on file prior to visit.    BP 120/82   Pulse 63   Temp 98.6 F (37 C) (Oral)   Ht 5\' 9"  (1.753 m)   Wt 250 lb (113.4 kg)   SpO2 97%   BMI 36.92 kg/m       Objective:   Physical Exam Vitals and nursing note reviewed.  Constitutional:      General: He is not in acute distress.    Appearance: Normal appearance. He is obese. He is not ill-appearing.  HENT:     Head: Normocephalic and atraumatic.     Right Ear: Tympanic membrane, ear canal and external ear normal. There is no impacted cerumen.     Left Ear: Tympanic membrane, ear canal and external ear normal. There is no impacted cerumen.     Nose: Nose normal. No congestion or rhinorrhea.     Mouth/Throat:     Mouth: Mucous membranes are moist.     Pharynx: Oropharynx is clear.  Eyes:     Extraocular Movements: Extraocular movements intact.     Conjunctiva/sclera: Conjunctivae normal.     Pupils: Pupils are equal, round, and reactive to light.  Neck:     Vascular: No carotid bruit.  Cardiovascular:     Rate and Rhythm: Normal rate and regular rhythm.     Pulses: Normal pulses.     Heart sounds: No murmur heard.    No friction rub. No gallop.  Pulmonary:     Effort: Pulmonary effort is normal.     Breath sounds: Normal breath sounds.  Abdominal:     General: Abdomen is flat. Bowel sounds are normal. There is no distension.     Palpations: Abdomen is soft. There is no mass.     Tenderness: There is no abdominal tenderness. There is no guarding or rebound.     Hernia: No hernia is present.  Musculoskeletal:         General: Normal range of motion.     Cervical back: Normal range of motion and neck supple.  Lymphadenopathy:     Cervical: No cervical adenopathy.  Skin:    General: Skin is warm and dry.     Capillary Refill: Capillary refill takes less than 2 seconds.  Neurological:     General: No focal deficit present.  Mental Status: He is alert and oriented to person, place, and time.  Psychiatric:        Mood and Affect: Mood normal.        Behavior: Behavior normal.        Thought Content: Thought content normal.        Judgment: Judgment normal.       Assessment & Plan:  1. Routine general medical examination at a health care facility Today patient counseled on age appropriate routine health concerns for screening and prevention, each reviewed and up to date or declined. Immunizations reviewed and up to date or declined. Labs ordered and reviewed. Risk factors for depression reviewed and negative. Hearing function and visual acuity are intact. ADLs screened and addressed as needed. Functional ability and level of safety reviewed and appropriate. Education, counseling and referrals performed based on assessed risks today. Patient provided with a copy of personalized plan for preventive services. - Refused shingles and flu shot today  - Follow up in one year or sooner if needed  2. Mixed hyperlipidemia  - Likely need to be placed on statin  - Lipid panel; Future - TSH; Future - CBC; Future - Comprehensive metabolic panel; Future - Hemoglobin A1c; Future  3. Screening for colon cancer  - Cologuard  4. Prostate cancer screening  - PSA; Future  5. Obesity, class 2 - Encouraged weight loss through diet and exercise  - Lipid panel; Future - TSH; Future - CBC; Future - Comprehensive metabolic panel; Future - Hemoglobin A1c; Future  Shirline Frees, NP

## 2023-07-20 NOTE — Patient Instructions (Signed)
It was great seeing you today   We will follow up with you regarding your lab work   Please let me know if you need anything   

## 2023-07-25 ENCOUNTER — Other Ambulatory Visit (INDEPENDENT_AMBULATORY_CARE_PROVIDER_SITE_OTHER): Payer: BC Managed Care – PPO

## 2023-07-25 DIAGNOSIS — Z125 Encounter for screening for malignant neoplasm of prostate: Secondary | ICD-10-CM | POA: Diagnosis not present

## 2023-07-25 DIAGNOSIS — E66812 Obesity, class 2: Secondary | ICD-10-CM

## 2023-07-25 DIAGNOSIS — E782 Mixed hyperlipidemia: Secondary | ICD-10-CM | POA: Diagnosis not present

## 2023-07-25 LAB — LIPID PANEL
Cholesterol: 183 mg/dL (ref 0–200)
HDL: 32.5 mg/dL — ABNORMAL LOW (ref >39.00)
LDL Cholesterol: 135 mg/dL — ABNORMAL HIGH (ref 0–99)
NonHDL: 150.82
Total CHOL/HDL Ratio: 6
Triglycerides: 77 mg/dL (ref 0.0–149.0)
VLDL: 15.4 mg/dL (ref 0.0–40.0)

## 2023-07-25 LAB — COMPREHENSIVE METABOLIC PANEL
ALT: 15 U/L (ref 0–53)
AST: 14 U/L (ref 0–37)
Albumin: 4 g/dL (ref 3.5–5.2)
Alkaline Phosphatase: 53 U/L (ref 39–117)
BUN: 13 mg/dL (ref 6–23)
CO2: 29 meq/L (ref 19–32)
Calcium: 8.9 mg/dL (ref 8.4–10.5)
Chloride: 105 meq/L (ref 96–112)
Creatinine, Ser: 1.03 mg/dL (ref 0.40–1.50)
GFR: 83.56 mL/min (ref >60.00)
Glucose, Bld: 85 mg/dL (ref 70–99)
Potassium: 4.2 meq/L (ref 3.5–5.1)
Sodium: 138 meq/L (ref 135–145)
Total Bilirubin: 0.4 mg/dL (ref 0.2–1.2)
Total Protein: 6.2 g/dL (ref 6.0–8.3)

## 2023-07-25 LAB — CBC
HCT: 45.3 % (ref 39.0–52.0)
Hemoglobin: 14.7 g/dL (ref 13.0–17.0)
MCHC: 32.6 g/dL (ref 30.0–36.0)
MCV: 87.3 fL (ref 78.0–100.0)
Platelets: 271 10*3/uL (ref 150.0–400.0)
RBC: 5.18 Mil/uL (ref 4.22–5.81)
RDW: 14.1 % (ref 11.5–15.5)
WBC: 12.2 10*3/uL — ABNORMAL HIGH (ref 4.0–10.5)

## 2023-07-25 LAB — PSA: PSA: 0.53 ng/mL (ref 0.10–4.00)

## 2023-07-25 LAB — TSH: TSH: 0.87 u[IU]/mL (ref 0.35–5.50)

## 2023-07-25 LAB — HEMOGLOBIN A1C: Hgb A1c MFr Bld: 5.3 % (ref 4.6–6.5)

## 2023-07-26 ENCOUNTER — Other Ambulatory Visit: Payer: Self-pay | Admitting: Adult Health

## 2023-07-26 MED ORDER — ROSUVASTATIN CALCIUM 10 MG PO TABS
10.0000 mg | ORAL_TABLET | Freq: Every day | ORAL | 3 refills | Status: DC
Start: 2023-07-26 — End: 2024-07-20

## 2023-08-01 DIAGNOSIS — Z1211 Encounter for screening for malignant neoplasm of colon: Secondary | ICD-10-CM | POA: Diagnosis not present

## 2023-08-09 LAB — COLOGUARD: COLOGUARD: NEGATIVE

## 2024-07-20 ENCOUNTER — Ambulatory Visit: Payer: Self-pay | Admitting: Adult Health

## 2024-07-20 ENCOUNTER — Other Ambulatory Visit: Payer: Self-pay | Admitting: Adult Health

## 2024-07-20 ENCOUNTER — Encounter: Payer: Self-pay | Admitting: Adult Health

## 2024-07-20 VITALS — BP 120/82 | HR 63 | Temp 98.1°F | Ht 70.0 in | Wt 243.0 lb

## 2024-07-20 DIAGNOSIS — Z114 Encounter for screening for human immunodeficiency virus [HIV]: Secondary | ICD-10-CM

## 2024-07-20 DIAGNOSIS — E782 Mixed hyperlipidemia: Secondary | ICD-10-CM

## 2024-07-20 DIAGNOSIS — Z125 Encounter for screening for malignant neoplasm of prostate: Secondary | ICD-10-CM | POA: Diagnosis not present

## 2024-07-20 DIAGNOSIS — E66811 Obesity, class 1: Secondary | ICD-10-CM

## 2024-07-20 DIAGNOSIS — Z0000 Encounter for general adult medical examination without abnormal findings: Secondary | ICD-10-CM | POA: Diagnosis not present

## 2024-07-20 DIAGNOSIS — Z1159 Encounter for screening for other viral diseases: Secondary | ICD-10-CM | POA: Diagnosis not present

## 2024-07-20 LAB — COMPREHENSIVE METABOLIC PANEL WITH GFR
ALT: 17 U/L (ref 0–53)
AST: 16 U/L (ref 0–37)
Albumin: 4.3 g/dL (ref 3.5–5.2)
Alkaline Phosphatase: 58 U/L (ref 39–117)
BUN: 11 mg/dL (ref 6–23)
CO2: 25 meq/L (ref 19–32)
Calcium: 9.2 mg/dL (ref 8.4–10.5)
Chloride: 105 meq/L (ref 96–112)
Creatinine, Ser: 1.22 mg/dL (ref 0.40–1.50)
GFR: 67.73 mL/min (ref >60.00)
Glucose, Bld: 86 mg/dL (ref 70–99)
Potassium: 4.2 meq/L (ref 3.5–5.1)
Sodium: 141 meq/L (ref 135–145)
Total Bilirubin: 0.5 mg/dL (ref 0.2–1.2)
Total Protein: 6.6 g/dL (ref 6.0–8.3)

## 2024-07-20 LAB — CBC
HCT: 45 % (ref 39.0–52.0)
Hemoglobin: 14.9 g/dL (ref 13.0–17.0)
MCHC: 33.2 g/dL (ref 30.0–36.0)
MCV: 85.2 fl (ref 78.0–100.0)
Platelets: 291 K/uL (ref 150.0–400.0)
RBC: 5.28 Mil/uL (ref 4.22–5.81)
RDW: 13.8 % (ref 11.5–15.5)
WBC: 11.6 K/uL — ABNORMAL HIGH (ref 4.0–10.5)

## 2024-07-20 LAB — LIPID PANEL
Cholesterol: 193 mg/dL (ref 0–200)
HDL: 29.4 mg/dL — ABNORMAL LOW (ref >39.00)
LDL Cholesterol: 136 mg/dL — ABNORMAL HIGH (ref 0–99)
NonHDL: 163.17
Total CHOL/HDL Ratio: 7
Triglycerides: 134 mg/dL (ref 0.0–149.0)
VLDL: 26.8 mg/dL (ref 0.0–40.0)

## 2024-07-20 LAB — PSA: PSA: 0.68 ng/mL (ref 0.10–4.00)

## 2024-07-20 LAB — TSH: TSH: 0.81 u[IU]/mL (ref 0.35–5.50)

## 2024-07-20 MED ORDER — ROSUVASTATIN CALCIUM 20 MG PO TABS: 20.0000 mg | ORAL_TABLET | Freq: Every day | ORAL | 3 refills | Status: AC

## 2024-07-20 NOTE — Progress Notes (Signed)
 Subjective:    Patient ID: Paul Huff, male    DOB: 03/13/1971, 53 y.o.   MRN: [id number]  HPI Patient presents for yearly preventative medicine examination. He is a pleasant 53 year old male who  has a past medical history of Acid reflux, Arthritis, and Sciatica.  Hyperlipidemia -  He was taking Crestor  10 mg daily but stopped this about 3 months ago when his pharmacy told him he did not have any refills left - this was sent in a year ago for 90 days + 3 refills.  Lab Results  Component Value Date   CHOL 183 07/25/2023   HDL 32.50 (L) 07/25/2023   LDLCALC 135 (H) 07/25/2023   TRIG 77.0 07/25/2023   CHOLHDL 6 07/25/2023    All immunizations and health maintenance protocols were reviewed with the patient and needed orders were placed. Does not want vaccinations today  Appropriate screening laboratory values were ordered for the patient including screening of hyperlipidemia, renal function and hepatic function. If indicated by BPH, a PSA was ordered.  Medication reconciliation,  past medical history, social history, problem list and allergies were reviewed in detail with the patient  Goals were established with regard to weight loss, exercise, and  diet in compliance with medications. 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   Wt Readings from Last 3 Encounters:  07/20/24 243 lb (110.2 kg)  07/20/23 250 lb (113.4 kg)  01/16/22 235 lb (106.6 kg)   He is up to date on routine colon cancer screening   He has no acute issues   Review of Systems  Constitutional: Negative.   HENT: Negative.    Eyes: Negative.   Respiratory: Negative.    Cardiovascular: Negative.   Gastrointestinal: Negative.   Endocrine: Negative.   Genitourinary: Negative.   Musculoskeletal: Negative.   Skin: Negative.   Allergic/Immunologic: Negative.   Neurological: Negative.   Hematological: Negative.   Psychiatric/Behavioral:  Negative.    All other systems reviewed and are negative.  Past Medical History:  Diagnosis Date   Acid reflux    Arthritis    Sciatica     Social History   Socioeconomic History   Marital status: Married    Spouse name: Not on file   Number of children: Not on file   Years of education: Not on file   Highest education level: Not on file  Occupational History   Not on file  Tobacco Use   Smoking status: Former    Current packs/day: 0.00    Types: Cigarettes    Quit date: 06/27/2017    Years since quitting: 7.0   Smokeless tobacco: Never  Vaping Use   Vaping status: Some Days   Substances: THC  Substance and Sexual Activity   Alcohol use: Yes    Alcohol/week: 7.0 standard drinks of alcohol    Types: 2 Cans of beer, 5 Shots of liquor per week    Comment: social drinking on weekend   Drug use: Yes    Types: Marijuana    Comment: 05/29/2019 [place]   Sexual activity: Not Currently  Other Topics Concern   Not on file  Social History Narrative   Working for [employer] as a Holiday representative    Married    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    Social Drivers of Health  Financial Resource Strain: Not on file  Food Insecurity: Not on file  Transportation Needs: Not on file  Physical Activity: Not on file  Stress: Not on file (08/04/2023)  Social Connections: Not on file  Intimate Partner Violence: Not on file    Past Surgical History:  Procedure Laterality Date   CARPAL TUNNEL RELEASE Left 08/02/2018   Procedure: LEFT CARPAL TUNNEL RELEASE (2nd);  Surgeon: Barbarann Oneil BROCKS, MD;  Location: [facility] OR;  Service: Orthopedics;  Laterality: Left;   CERVICAL DISC ARTHROPLASTY N/A 08/02/2018   Procedure: C3-4 CERVICAL DISC ARTHROPLASTY (1st);  Surgeon: Barbarann Oneil BROCKS, MD;  Location: [facility] OR;  Service: Orthopedics;  Laterality: N/A;   LUMBAR LAMINECTOMY N/A 06/06/2019   Procedure: RIGHT LUMBAR FOUR-FIVE  MICRODISCECTOMY;  Surgeon: Barbarann Oneil BROCKS, MD;  Location: [facility] OR;  Service: Orthopedics;  Laterality: N/A;   TONSILLECTOMY  1985    Family History  Adopted: Yes    No Known Allergies  Current Outpatient Medications on File Prior to Visit  Medication Sig Dispense Refill   rosuvastatin  (CRESTOR ) 10 MG tablet Take 1 tablet (10 mg total) by mouth daily. (Patient not taking: Reported on 07/20/2024) 90 tablet 3   No current facility-administered medications on file prior to visit.    BP 120/82   Pulse 63   Temp 98.1 F (36.7 C) (Oral)   Ht 5' 10 (1.778 m)   Wt 243 lb (110.2 kg)   SpO2 97%   BMI 34.87 kg/m       Objective:   Physical Exam Vitals and nursing note reviewed.  Constitutional:      General: He is not in acute distress.    Appearance: Normal appearance. He is obese. He is not ill-appearing.  HENT:     Head: Normocephalic and atraumatic.     Right Ear: Tympanic membrane, ear canal and external ear normal. There is no impacted cerumen.     Left Ear: Tympanic membrane, ear canal and external ear normal. There is no impacted cerumen.     Nose: Nose normal. No congestion or rhinorrhea.     Mouth/Throat:     Mouth: Mucous membranes are moist.     Pharynx: Oropharynx is clear.  Eyes:     Extraocular Movements: Extraocular movements intact.     Conjunctiva/sclera: Conjunctivae normal.     Pupils: Pupils are equal, round, and reactive to light.  Neck:     Vascular: No carotid bruit.  Cardiovascular:     Rate and Rhythm: Normal rate and regular rhythm.     Pulses: Normal pulses.     Heart sounds: No murmur heard.    No friction rub. No gallop.  Pulmonary:     Effort: Pulmonary effort is normal.     Breath sounds: Normal breath sounds.  Abdominal:     General: Abdomen is flat. Bowel sounds are normal. There is no distension.     Palpations: Abdomen is soft. There is no mass.     Tenderness: There is no abdominal tenderness. There is no guarding or rebound.     Hernia: No hernia is present.   Musculoskeletal:        General: Normal range of motion.     Cervical back: Normal range of motion and neck supple.  Lymphadenopathy:     Cervical: No cervical adenopathy.  Skin:    General: Skin is warm and dry.     Capillary Refill: Capillary refill takes less than 2 seconds.  Neurological:  General: No focal deficit present.     Mental Status: He is alert and oriented to person, place, and time.  Psychiatric:        Mood and Affect: Mood normal.        Behavior: Behavior normal.        Thought Content: Thought content normal.        Judgment: Judgment normal.           Assessment & Plan:  1. Routine general medical examination at a health care facility (Primary) Today patient counseled on age appropriate routine health concerns for screening and prevention, each reviewed and up to date or declined. Immunizations reviewed and up to date or declined. Labs ordered and reviewed. Risk factors for depression reviewed and negative. Hearing function and visual acuity are intact. ADLs screened and addressed as needed. Functional ability and level of safety reviewed and appropriate. Education, counseling and referrals performed based on assessed risks today. Patient provided with a copy of personalized plan for preventive services.   2. Mixed hyperlipidemia - Check lipid panel today. Consider increase in dose  - Lipid panel; Future - TSH; Future - CBC; Future - Comprehensive metabolic panel with GFR; Future  3. Prostate cancer screening  - PSA; Future  4. Class 1 obesity - He has lost about 7 pounds over the year. Keep exercising and eat healthy  - Lipid panel; Future - TSH; Future - CBC; Future - Comprehensive metabolic panel with GFR; Future  5. Encounter for screening for HIV  - HIV Antibody (routine testing w rflx); Future  6. Need for hepatitis C screening test  - Hep C Antibody; Future  Darleene Shape, NP

## 2024-07-21 LAB — HEPATITIS C ANTIBODY: Hepatitis C Ab: NONREACTIVE

## 2024-07-21 LAB — HIV ANTIBODY (ROUTINE TESTING W REFLEX)
HIV 1&2 Ab, 4th Generation: NONREACTIVE
HIV FINAL INTERPRETATION: NEGATIVE

## 2024-10-05 ENCOUNTER — Other Ambulatory Visit: Payer: Self-pay | Admitting: Adult Health

## 2024-10-05 NOTE — Telephone Encounter (Signed)
" °  The original prescription was discontinued on 07/20/2024 by Merna Huxley, NP. Renewing this prescription may not be appropriate.   "
